# Patient Record
Sex: Female | Born: 1960
Health system: Southern US, Community
[De-identification: ages and names within clinical notes are randomized; demographics above are authoritative.]

## PROBLEM LIST (undated history)

## (undated) DIAGNOSIS — Z9109 Other allergy status, other than to drugs and biological substances: Secondary | ICD-10-CM

## (undated) DIAGNOSIS — I493 Ventricular premature depolarization: Secondary | ICD-10-CM

## (undated) DIAGNOSIS — R1310 Dysphagia, unspecified: Secondary | ICD-10-CM

## (undated) DIAGNOSIS — E785 Hyperlipidemia, unspecified: Secondary | ICD-10-CM

## (undated) HISTORY — PX: SHOULDER SURGERY: SHX246

## (undated) HISTORY — PX: COLONOSCOPY: SHX174

## (undated) HISTORY — DX: Hyperlipidemia, unspecified: E78.5

## (undated) HISTORY — DX: Ventricular premature depolarization: I49.3

## (undated) HISTORY — PX: ROTATOR CUFF REPAIR: SHX139

## (undated) HISTORY — PX: OTHER SURGICAL HISTORY: SHX169

## (undated) HISTORY — DX: Other allergy status, other than to drugs and biological substances: Z91.09

## (undated) HISTORY — DX: Dysphagia, unspecified: R13.10

---

## 1999-11-18 ENCOUNTER — Encounter: Payer: Self-pay | Admitting: Obstetrics and Gynecology

## 1999-11-18 ENCOUNTER — Encounter: Admission: RE | Admit: 1999-11-18 | Discharge: 1999-11-18 | Payer: Self-pay | Admitting: Obstetrics and Gynecology

## 2000-11-24 ENCOUNTER — Encounter: Admission: RE | Admit: 2000-11-24 | Discharge: 2000-11-24 | Payer: Self-pay | Admitting: Obstetrics and Gynecology

## 2000-11-24 ENCOUNTER — Encounter: Payer: Self-pay | Admitting: Obstetrics and Gynecology

## 2001-12-07 ENCOUNTER — Encounter: Payer: Self-pay | Admitting: Obstetrics and Gynecology

## 2001-12-07 ENCOUNTER — Encounter: Admission: RE | Admit: 2001-12-07 | Discharge: 2001-12-07 | Payer: Self-pay | Admitting: Obstetrics and Gynecology

## 2001-12-20 ENCOUNTER — Other Ambulatory Visit: Admission: RE | Admit: 2001-12-20 | Discharge: 2001-12-20 | Payer: Self-pay | Admitting: Obstetrics and Gynecology

## 2002-12-13 ENCOUNTER — Ambulatory Visit (HOSPITAL_COMMUNITY): Admission: RE | Admit: 2002-12-13 | Discharge: 2002-12-13 | Payer: Self-pay | Admitting: Obstetrics and Gynecology

## 2002-12-13 ENCOUNTER — Encounter: Payer: Self-pay | Admitting: Obstetrics and Gynecology

## 2003-03-13 ENCOUNTER — Other Ambulatory Visit: Admission: RE | Admit: 2003-03-13 | Discharge: 2003-03-13 | Payer: Self-pay | Admitting: Obstetrics and Gynecology

## 2004-01-02 ENCOUNTER — Ambulatory Visit (HOSPITAL_COMMUNITY): Admission: RE | Admit: 2004-01-02 | Discharge: 2004-01-02 | Payer: Self-pay | Admitting: Obstetrics and Gynecology

## 2005-01-02 ENCOUNTER — Ambulatory Visit (HOSPITAL_COMMUNITY): Admission: RE | Admit: 2005-01-02 | Discharge: 2005-01-02 | Payer: Self-pay | Admitting: Obstetrics and Gynecology

## 2006-01-19 ENCOUNTER — Ambulatory Visit (HOSPITAL_COMMUNITY): Admission: RE | Admit: 2006-01-19 | Discharge: 2006-01-19 | Payer: Self-pay | Admitting: Obstetrics and Gynecology

## 2007-02-02 ENCOUNTER — Ambulatory Visit (HOSPITAL_COMMUNITY): Admission: RE | Admit: 2007-02-02 | Discharge: 2007-02-02 | Payer: Self-pay | Admitting: Obstetrics and Gynecology

## 2007-12-05 ENCOUNTER — Emergency Department (HOSPITAL_COMMUNITY): Admission: EM | Admit: 2007-12-05 | Discharge: 2007-12-05 | Payer: Self-pay | Admitting: Emergency Medicine

## 2008-02-03 ENCOUNTER — Ambulatory Visit (HOSPITAL_COMMUNITY): Admission: RE | Admit: 2008-02-03 | Discharge: 2008-02-03 | Payer: Self-pay | Admitting: Obstetrics and Gynecology

## 2008-03-09 ENCOUNTER — Encounter: Admission: RE | Admit: 2008-03-09 | Discharge: 2008-03-09 | Payer: Self-pay | Admitting: Internal Medicine

## 2008-04-19 ENCOUNTER — Ambulatory Visit: Payer: Self-pay | Admitting: Gastroenterology

## 2008-04-19 DIAGNOSIS — K3189 Other diseases of stomach and duodenum: Secondary | ICD-10-CM | POA: Insufficient documentation

## 2008-04-19 DIAGNOSIS — R197 Diarrhea, unspecified: Secondary | ICD-10-CM | POA: Insufficient documentation

## 2008-04-19 DIAGNOSIS — R1013 Epigastric pain: Secondary | ICD-10-CM

## 2008-04-19 LAB — CONVERTED CEMR LAB
BUN: 10 mg/dL (ref 6–23)
Basophils Absolute: 0 10*3/uL (ref 0.0–0.1)
Basophils Relative: 0.1 % (ref 0.0–3.0)
CO2: 29 meq/L (ref 19–32)
Calcium: 9.1 mg/dL (ref 8.4–10.5)
Chloride: 109 meq/L (ref 96–112)
Creatinine, Ser: 0.8 mg/dL (ref 0.4–1.2)
Eosinophils Absolute: 0 10*3/uL (ref 0.0–0.7)
Eosinophils Relative: 0.9 % (ref 0.0–5.0)
GFR calc Af Amer: 98 mL/min
GFR calc non Af Amer: 81 mL/min
Glucose, Bld: 93 mg/dL (ref 70–99)
HCT: 39.6 % (ref 36.0–46.0)
Hemoglobin: 13.6 g/dL (ref 12.0–15.0)
IgA: 236 mg/dL (ref 68–378)
Lymphocytes Relative: 18.3 % (ref 12.0–46.0)
MCHC: 34.3 g/dL (ref 30.0–36.0)
MCV: 93.2 fL (ref 78.0–100.0)
Monocytes Absolute: 0.5 10*3/uL (ref 0.1–1.0)
Monocytes Relative: 8.8 % (ref 3.0–12.0)
Neutro Abs: 4 10*3/uL (ref 1.4–7.7)
Neutrophils Relative %: 71.9 % (ref 43.0–77.0)
Platelets: 172 10*3/uL (ref 150–400)
Potassium: 4 meq/L (ref 3.5–5.1)
RBC: 4.25 M/uL (ref 3.87–5.11)
RDW: 12.4 % (ref 11.5–14.6)
Sodium: 141 meq/L (ref 135–145)
WBC: 5.5 10*3/uL (ref 4.5–10.5)

## 2008-04-21 LAB — CONVERTED CEMR LAB: Tissue Transglutaminase Ab, IgA: 0.2 units (ref <7)

## 2008-04-24 ENCOUNTER — Telehealth: Payer: Self-pay | Admitting: Gastroenterology

## 2008-04-26 ENCOUNTER — Encounter: Payer: Self-pay | Admitting: Gastroenterology

## 2008-04-26 ENCOUNTER — Ambulatory Visit: Payer: Self-pay | Admitting: Gastroenterology

## 2008-04-28 ENCOUNTER — Encounter: Payer: Self-pay | Admitting: Gastroenterology

## 2008-05-30 ENCOUNTER — Ambulatory Visit: Payer: Self-pay | Admitting: Gastroenterology

## 2008-10-24 ENCOUNTER — Telehealth: Payer: Self-pay | Admitting: Gastroenterology

## 2008-11-22 ENCOUNTER — Ambulatory Visit: Payer: Self-pay | Admitting: Gastroenterology

## 2008-12-06 ENCOUNTER — Encounter: Payer: Self-pay | Admitting: Gastroenterology

## 2008-12-06 ENCOUNTER — Ambulatory Visit: Payer: Self-pay | Admitting: Gastroenterology

## 2008-12-07 ENCOUNTER — Encounter: Payer: Self-pay | Admitting: Gastroenterology

## 2009-02-14 ENCOUNTER — Ambulatory Visit (HOSPITAL_COMMUNITY): Admission: RE | Admit: 2009-02-14 | Discharge: 2009-02-14 | Payer: Self-pay | Admitting: Obstetrics and Gynecology

## 2010-02-25 ENCOUNTER — Ambulatory Visit (HOSPITAL_COMMUNITY): Admission: RE | Admit: 2010-02-25 | Discharge: 2010-02-25 | Payer: Self-pay | Admitting: Obstetrics and Gynecology

## 2010-09-12 NOTE — Assessment & Plan Note (Signed)
History of Present Illness Visit Type: consult Primary GI MD: Rob Bunting MD Primary Provider: Creola Corn, MD Requesting Provider: Creola Corn, MD Chief Complaint: belching History of Present Illness:    very pleasant 50 year old woman who has had dyspeptic symptoms and relatively loose stools for the past 6-7 months.  Started in march, gained 5 pounds in past 4 months.   Never before.  2-3 times a week bloating, hard stomach.  Flatus helps, belching helps.  Never gets pyrosis.  Does get mild epig pressure.  Has tried zantac, pepcid, pepto, rolaids, gas-ex.  Tried prilosec for a month, usually 20-30 min afterwards.  No difference.    Tried off coffee.    Tends to have loose bowel since March.  Once a day.  Never bloody.   Has been on probiotics for the past 3 weeks and has noticed no difference.  she had an upper GI examination the end of July 2009, this showed some mild reflux but was otherwise normal.               Prior Medications Reviewed Using: Patient Recall  Updated Prior Medication List: ZYRTEC ALLERGY 10 MG TABS (CETIRIZINE HCL) as needed MULTIVITAMINS  TABS (MULTIPLE VITAMIN) once daily CALCIUM 500/VITAMIN D 500-125 MG-UNIT TABS (CALCIUM CARBONATE-VITAMIN D) once daily FISH OIL 1200 MG CAPS (OMEGA-3 FATTY ACIDS) once daily VITAMIN C 500 MG CHEW (ASCORBIC ACID) once daily ULTIMATE PROBIOTIC FORMULA  CAPS (LACTOBACILLUS) once daily  Current Allergies: No known allergies   Past Medical History:    Reviewed history and no changes required:       Unremarkable  Past Surgical History:    Reviewed history and no changes required:       Unremarkable   Family History:    breast cancer, grandmother and aunt    Heart disease father and grandfather  Social History:    she is married, she has 4 children, she works as a Futures trader, she does not smoke cigarettes, she drinks 2 alcoholic beverages on weekends, she drinks one to 2 caffeinated beverages a  day    Review of Systems       Pertinent positive and negative review of systems were noted in the above HPI and GI specific review of systems.  All other review of systems was otherwise negative.    Vital Signs:  Patient Profile:   50 Years Old Female Height:     63 inches Weight:      122.50 pounds BMI:     21.78 Pulse rate:   68 / minute Pulse rhythm:   regular BP sitting:   110 / 68  (left arm)  Vitals Entered By: June McMurray CMA (April 19, 2008 10:25 AM)                  Physical Exam  Constitutional: generally well appearing Psychiatric: alert and oriented times 3 Eyes: extraocular movements intact Mouth: oropharynx moist, no lesions Neck: supple, no lymphadenopathy Cardiovascular: heart regular rate and rythm Lungs: CTA bilaterally Abdomen: soft, non-tender, non-distended, no obvious ascites, no peritoneal signs, normal bowel sounds Extremities: no lower extremity edema bilaterally Skin: no lesions on visible extremities     Impression & Recommendations:  Problem # 1:  DYSPEPSIA (ICD-536.8) Mild dyspeptic symptoms, she does have slight reflux on barium upper GI examination however I'm not convinced that acid is causing her symptoms and she's had zero response to proton pump inhibitor and H2 blockers all taken appropriately. Perhaps she has H.  pylori. Lactose intolerance is common and that can cause similar symptoms.  celiac sprue is also in the differential list.  She will get a basic set of labs today including a CBC a basic metabolic profile TTG and IgA. She will try a lactose-free diet for the next 5-7 days. We will arrange for an EGD to be done in about a week's time if she has noticed a significant difference off of dairy then the EGD can be canceled.    Problem # 2:  DIARRHEA (ICD-787.91) loose stools started around the same time as her upper GI symptoms, probably a similar etiology. She is 89 will have to consider colonoscopy if the above  workup is otherwise unrevealing.   Patient Instructions: 1)  You will get lab test(s) done (cbc, bmet, TTG, IgA). 2)  You will be scheduled to have an uppper endoscopy (late next week). 3)  Dairy free trial for 7 days. 4)  A copy of this information will be sent to Dr. Timothy Lasso.    ]  Appended Document: Orders Update/labs    Clinical Lists Changes  Orders: Added new Test order of TLB-CBC Platelet - w/Differential (85025-CBCD) - Signed Added new Test order of TLB-BMP (Basic Metabolic Panel-BMET) (80048-METABOL) - Signed Added new Test order of T-Tissue Transglutamase Ab IgA (69629-52841) - Signed Added new Test order of TLB-IgA (Immunoglobulin A) (82784-IGA) - Signed  Appended Document: Orders Update/EGD    Clinical Lists Changes  Orders: Added new Test order of EGD (EGD) - Signed  Appended Document: Orders Update    Clinical Lists Changes  Orders: Added new Test order of TLB-CBC Platelet - w/Differential (85025-CBCD) - Signed Added new Test order of TLB-BMP (Basic Metabolic Panel-BMET) (80048-METABOL) - Signed Added new Test order of TLB-IgA (Immunoglobulin A) (82784-IGA) - Signed

## 2010-09-12 NOTE — Assessment & Plan Note (Signed)
  GI problem list: 1. Dyspepsia, EGD September 2009 showed mild gastritis. Biopsies confirmed H. pylori, she was treated with appropriate antibiotics.  Dyspepsia improved after antibiotics.    History of Present Illness Visit Type: follow up Primary GI MD: Rob Bunting MD Primary Murlene Revell: Creola Corn, MD Requesting Bailee Metter: Creola Corn, MD Chief Complaint: follow-up visit, egd History of Present Illness:     Began antibiotics for h. pylori.  Took a while, but dyspepsia completely resolved and she feels very well now. She has minor belching only.  Bowel no longer loose.             Updated Prior Medication List: ZYRTEC ALLERGY 10 MG TABS (CETIRIZINE HCL) as needed MULTIVITAMINS  TABS (MULTIPLE VITAMIN) once daily CALCIUM 500/VITAMIN D 500-125 MG-UNIT TABS (CALCIUM CARBONATE-VITAMIN D) once daily FISH OIL 1200 MG CAPS (OMEGA-3 FATTY ACIDS) once daily VITAMIN C 500 MG CHEW (ASCORBIC ACID) once daily  Current Allergies (reviewed today): No known allergies       Vital Signs:  Patient Profile:   50 Years Old Female Height:     63 inches Weight:      124 pounds BMI:     22.05 BSA:     1.58 Pulse rate:   64 / minute Pulse rhythm:   regular BP sitting:   100 / 62  (right arm)  Vitals Entered By: Teresita Madura CMA (May 30, 2008 1:55 PM)                  Physical Exam  Constitutional: generally well appearing Psychiatric: alert and oriented times 3 Abdomen: soft, non-tender, non-distended, normal bowel sounds     Impression & Recommendations:  Problem # 1:  DYSPEPSIA (ICD-536.8) dyspepsia improved after H. pylori eradication. [name]. [name].   Patient Instructions: 1)  A copy of this information will be sent to Dr. Timothy Lasso. 2)  [name]. 3)  [name].    ]

## 2010-09-12 NOTE — Letter (Signed)
Summary: Results Letter  [facility]  [address] [place], [place] [address]   Phone: [phone]  Fax: [phone]        December 07, 2008 MRN: [id number]    KAREENA ARRAMBIDE [address] [place], [place]  [address]    Rosey Bath,   The polyp that I removed during your recent procedure was proven to be adenomatous.  These are pre-cancerous polyps that may have grown into cancers if they had not been removed.  Based on current nationally recognized surveillance guidelines, I recommend that you have a repeat colonoscopy in 5 years.  We will therefore put your information in our reminder system and will contact you in 5 years to schedule a repeat procedure.  Please call if you have any questions or concerns or if any new GI issues arise before then.  Go Cubs!       Sincerely,  Rachael Fee MD  This letter has been electronically signed by your physician.

## 2010-09-12 NOTE — Procedures (Signed)
Summary: Colonoscopy   Colonoscopy  Procedure date:  12/06/2008  Findings:      Location:  [facility].    Procedures Next Due Date:    Colonoscopy: 12/2013  COLONOSCOPY PROCEDURE REPORT  PATIENT:  Destiny [name], Destiny [name]  MR#:  [id number] BIRTHDATE:   December 08, 1960, 50 yrs. old   GENDER:   female  ENDOSCOPIST:   Rachael Fee, MD Referred by: Creola Corn, M.D.  PROCEDURE DATE:  12/06/2008 PROCEDURE:  Colonoscopy with snare polypectomy ASA CLASS:   Class II INDICATIONS: family Hx of polyps (her brother had a small pre-cancerous polyp)  MEDICATIONS:    Fentanyl 75 mcg IV, Versed 10 mg IV  DESCRIPTION OF PROCEDURE:   After the risks benefits and alternatives of the procedure were thoroughly explained, informed consent was obtained.  Digital rectal exam was performed and revealed no rectal masses.   The LB [id number] endoscope was introduced through the anus and advanced to the cecum, which was identified by both the appendix and ileocecal valve, without limitations.  The quality of the prep was excellent, using MoviPrep.  The instrument was then slowly withdrawn as the colon was fully examined. <<PROCEDUREIMAGES>>            <<OLD IMAGES>>  FINDINGS:  There was a 4mm, somewhat pedunculated polyp in the rectum. This was removed with snare cautery and sent to pathology (jar 1) (see image5, image6, and image7).  External hemorrhoids were found. These were small and non-thrombosed.  Mild diverticulosis was found sigmoid to descending colon segments.   This was otherwise a normal examination of the colon (see image2, image3, and image4).   Retroflexed views in the rectum revealed no abnormalities.   The scope was then withdrawn from the patient and the procedure completed.  COMPLICATIONS:   None  ENDOSCOPIC IMPRESSION:  1) 4mm pedunculated polyp in the rectum, removed and sent to pathology  2) External hemorrhoids, small  3) Mild diverticulosis in the sigmoid to  descending  4) Otherwise normal examination  RECOMMENDATIONS:  1) If the polyp(s) removed today are proven to be adenomatous (pre-cancerous) polyps, you will need a repeat colonoscopy in 5 years. Otherwise you should continue to follow colorectal cancer screening guidelines for "routine risk" patients with colonoscopy in 10 years.  2) You will recieve a letter within 1-2 weeks with the results of your biopsy as well as final recommendations. Please call my office if you have not received a letter after 3 weeks.  REPEAT EXAM:   await pathology   _______________________________ Rachael Fee, MD           REPORT OF SURGICAL PATHOLOGY   Case #: [id number] Patient Name: Destiny [name], Destiny [name]. Office Chart Number:  [id number]   MRN: [id number] Pathologist: H. Hollice Espy, MD DOB/Age  17-Nov-1960 (Age: 50)    Gender: F Date Taken:  12/06/2008 Date Received: 12/06/2008   FINAL DIAGNOSIS   ***MICROSCOPIC EXAMINATION AND DIAGNOSIS***   RECTUM, POLYP, BIOPSY:  TUBULAR ADENOMA.  NO HIGH GRADE DYSPLASIA OR MALIGNANCY IDENTIFIED (ONE FRAGMENT).   COMMENT There is adenomatous epithelium having a predominantly tubular growth pattern consistent with a tubular adenoma if the biopsy is representative of the entire lesion. No high grade dysplasia or evidence of malignancy is identified. Clinical correlation is recommended.  (HCL:mw, 12/07/08)    mw Date Reported:  12/07/2008     H. Hollice Espy, MD *** Electronically Signed Out By [facility] ***   December 07, 2008 MRN: [id number]    [facility]  [address] [place], [place]  [address]    Rosey Bath,   The polyp that I removed during your recent procedure was proven to be adenomatous.  These are pre-cancerous polyps that may have grown into cancers if they had not been removed.  Based on current nationally recognized surveillance guidelines, I recommend that you have a repeat colonoscopy in 5 years.  We will therefore put your information in  our reminder system and will contact you in 5 years to schedule a repeat procedure.  Please call if you have any questions or concerns or if any new GI issues arise before then.  Go Cubs!       Sincerely,  Rachael Fee MD  This letter has been electronically signed by your physician.   Signed by Rachael Fee MD on 12/07/2008 at 12:01 PM   This report was created from the original endoscopy report, which was reviewed and signed by the above listed endoscopist.    ________________________________________________________________________

## 2010-09-12 NOTE — Miscellaneous (Signed)
Summary: rx  Clinical Lists Changes  Medications: Added new medication of PREVPAC   MISC (AMOXICILL-CLARITHRO-LANSOPRAZ) take as directed - Signed Rx of PREVPAC   MISC (AMOXICILL-CLARITHRO-LANSOPRAZ) take as directed;  #1 x 0;  Signed;  Entered by: Rachael Fee MD;  Authorized by: Rachael Fee MD;  Method used: [facility]  [phone]*, [address], [place], [place]  [address], Ph: [phone] or [phone], Fax: [phone]    Prescriptions: PREVPAC   MISC (AMOXICILL-CLARITHRO-LANSOPRAZ) take as directed  #1 x 0   Entered and Authorized by:   Rachael Fee MD   Signed by:   Rachael Fee MD on 04/28/2008   Method used:   [facility]ically to        [facility]  [phone]* (retail)       [address] [place], [place]  [address]       Ph: [phone] or [phone]       Fax: [phone]   RxID:   [id number]

## 2010-09-12 NOTE — Procedures (Signed)
Summary: EGD   EGD  Procedure date:  04/26/2008  Findings:      Location: [facility]    Patient Name: Sheray, Grist MRN:  Procedure Procedures: Panendoscopy (EGD) CPT: 43235.    with biopsy(s)/brushing(s). CPT: D1846139.  Personnel: Endoscopist: Rachael Fee, MD.  Referred By: Creola Corn, MD.  Exam Location: Exam performed in Endoscopy Suite. Outpatient  Patient Consent: Procedure, Alternatives, Risks and Benefits discussed, consent obtained, from patient. Consent was obtained by the RN.  Indications Symptoms: Dyspepsia,  Comments: loose stools History  Current Medications: Patient is not currently taking Coumadin.  Comments: Patient history reviewed and updated, pre-procedure physical performed prior to initiation of sedation? yes Pre-Exam Physical: Performed Apr 26, 2008  Cardio-pulmonary exam, Abdominal exam, Mental status exam WNL.  Exam Exam Info: Maximum depth of insertion Duodenum, intended Duodenum. Patient position: on left side. Gastric retroflexion performed. Images taken. ASA Classification: II. Tolerance: good.  Sedation Meds: Patient assessed and found to be appropriate for moderate (conscious) sedation. Fentanyl 50 mcg. given IV. Versed 6 mg. given IV. Cetacaine Spray 2 sprays given aerosolized.  Monitoring: BP and pulse monitoring done. Oximetry used. Supplemental O2 given  Findings - Normal: Proximal Esophagus to Duodenal 2nd Portion. Comments: OTHERWISE NORMAL EXAMINATION.  - MUCOSAL ABNORMALITY: Body to Antrum. Biopsy/Mucosal Abn taken. Path # 1. Comment: MILD, NON-SPECIFIC GASTRITIS, FRIABLE MUCOSA.   Assessment Abnormal examination, see findings above.  Comments: MILD, NON-SPECIFIC GASTRITIS.  BIOPSIES WERE TAKEN TO CHECK FOR H. PYLORI AND IF POSITIVE THIS COULD EXPLAIN HER SYMPTOMS AND I WOULD START HER ON H. PYLORI ERADICATION ANTIBIOTICS.   IF NEGATIVE FOR H. PYLORI, WILL START HER ON FLAGYL EMPIRICALLY FOR HER  DYSPEPTIC SYMPTOMS, LOOSE STOOLS. Events  Unplanned Intervention: No unplanned interventions were required.  Unplanned Events: There were no complications.    cc.   Jonny Ruiz Russo,MD   REPORT OF SURGICAL PATHOLOGY   Case #: [id number] Patient Name: SYNIA, DOUGLASS. Office Chart Number:  [id number]   MRN: [id number] Pathologist: Alden Server A. Delila Spence, MD DOB/Age  20-Sep-1960 (Age: 24)    Gender: F Date Taken:  04/26/2008 Date Received: 04/26/2008   FINAL DIAGNOSIS   ***MICROSCOPIC EXAMINATION AND DIAGNOSIS***   STOMACH, BIOPSY:  CHRONIC, ACTIVE GASTRITIS WITH HELICOBACTER PYLORI.  NO DYSPLASIA OR MALIGNANCY IDENTIFIED.   COMMENT A Warthin-Starry stain is performed to determine the possibility of the presence of Helicobacter pylori. Organisms of Helicobacter pylori are identified on the Warthin-Starry stain. The control(s) stained appropriately. (EAA:mj 04/27/08)   mj Date Reported:  04/27/2008     Alden Server A. Delila Spence, MD *** Electronically Signed Out By [facility] ***   Clinical information R/O H. pylori (jy)   specimen(s) obtained Stomach, biopsy   Gross Description Received in formalin are tan, soft tissue fragments that are submitted in toto.  Number:  4          Size:      0.2 to 0.4 cm  (JBM:kv 04-26-08)   kv/     Signed by Rachael Fee MD on 04/28/2008 at 9:14 AM  ________________________________________________________________________ i lm on her vm, will call in prev pac   Signed by Rachael Fee MD on 04/28/2008 at 9:15 AM  ________________________________________________________________________ please call her to arrange rov in 4-5 weeks   Signed by Rachael Fee MD on 04/28/2008 at 9:21 AM  ________________________________________________________________________ Message left for pt. to call in and make an appt. in 4 weeks   Signed by Teryl Lucy RN on 05/01/2008 at 9:37  AM  ________________________________________________________________________    [facility] [address] [place], [place]  [address]    Dear Rosey Bath,  The biopsies taken during your recent procedure showed that you do have the H. pylori bacteria in your stomach. I suspect this is causing most of your symptoms. I left a message on your voicemail explaining the same and have called in a prescription for you for two antibiotics and one antiacid medicine to be taken as directed that should eradicate this bacteria. I would like to see you in my office in about one month to see how this has helped you.  Please feel free to call if you have any further questions or concerns.        Sincerely,  Rachael Fee MD  This letter has been electronically signed by your physician.   Signed by Rachael Fee MD on 04/28/2008 at 9:18 AM  ________________________________________________________________________

## 2010-09-12 NOTE — Miscellaneous (Signed)
Summary: [facility] Previsit/prep  Clinical Lists Changes  Medications: Added new medication of MOVIPREP 100 GM  SOLR (PEG-KCL-NACL-NASULF-NA ASC-C) As per prep instructions. - Signed Rx of MOVIPREP 100 GM  SOLR (PEG-KCL-NACL-NASULF-NA ASC-C) As per prep instructions.;  #1 x 0;  Signed;  Entered by: Wyona Almas RN;  Authorized by: Rachael Fee MD;  Method used: [facility]  [phone]*, [address], [place], [place]  [address], Ph: [phone] or [phone], Fax: [phone] Observations: Added new observation of NKA: T (11/22/2008 12:54)    Prescriptions: MOVIPREP 100 GM  SOLR (PEG-KCL-NACL-NASULF-NA ASC-C) As per prep instructions.  #1 x 0   Entered by:   Wyona Almas RN   Authorized by:   Rachael Fee MD   Signed by:   Wyona Almas RN on 11/22/2008   Method used:   Electronically to        [facility]  [phone]* (retail)       [address] [place], [place]  [address]       Ph: [phone] or [phone]       Fax: [phone]   RxID:   [id number]

## 2010-09-12 NOTE — Progress Notes (Signed)
Summary: proc on Wed  Phone Note Refill Request Call back at [phone]   Caller: Patient Call For: JACOBS Details for Reason: proc on Wed Summary of Call: sch for EGD on WEd wondering if Colon had be done at the same time since pt is 50 yrs old Initial call taken by: Guadlupe [facility],  April 24, 2008 4:44 PM  Follow-up for Phone Call        Pt has been off dairy products x1 week and it has not made any difference in her symptoms. Wanting to know if a colon should be added to her procedure on Wednesday.  Teryl Lucy RN  April 24, 2008 5:03 PM   Additional Follow-up for Phone Call Additional follow up Details #1::        lets do the egd.  if that isn't helpful, she's going to be put on empiric abx (flagyl).  i don't think colo is necessary at this point, but if flagyl isn't helpful...should go ahead with colonoscopy Additional Follow-up by: Rachael Fee MD,  April 25, 2008 7:46 AM    Additional Follow-up for Phone Call Additional follow up Details #2::    Pt. notified of Dr. Christella Hartigan directives about having colon now.  Teryl Lucy RN  April 25, 2008 12:56 PM

## 2010-09-12 NOTE — Progress Notes (Signed)
Summary: when is her colon due?  Phone Note Call from Patient Call back at [phone] cell   Call For: Dr Christella Hartigan Reason for Call: Talk to Nurse Summary of Call: When is she due for Colon-her brother had colon cancer. Initial call taken by: Leanor Kail [facility],  October 24, 2008 11:59 AM  Follow-up for Phone Call        now...up in [facility], whenever she is ready. Follow-up by: Rachael Fee MD,  October 24, 2008 1:27 PM  Additional Follow-up for Phone Call Additional follow up Details #1::        Message left for pt. to call and schedule colon at her convience. Additional Follow-up by: Teryl Lucy RN,  October 24, 2008 3:07 PM

## 2010-11-19 ENCOUNTER — Other Ambulatory Visit: Payer: Self-pay | Admitting: Obstetrics and Gynecology

## 2011-01-31 ENCOUNTER — Other Ambulatory Visit (HOSPITAL_COMMUNITY): Payer: Self-pay | Admitting: Obstetrics and Gynecology

## 2011-01-31 DIAGNOSIS — Z1231 Encounter for screening mammogram for malignant neoplasm of breast: Secondary | ICD-10-CM

## 2011-03-13 ENCOUNTER — Ambulatory Visit (HOSPITAL_COMMUNITY)
Admission: RE | Admit: 2011-03-13 | Discharge: 2011-03-13 | Disposition: A | Discharge status: Home / Self Care | Payer: 59 | Source: Ambulatory Visit | Attending: Obstetrics and Gynecology | Admitting: Obstetrics and Gynecology

## 2011-03-13 DIAGNOSIS — Z1231 Encounter for screening mammogram for malignant neoplasm of breast: Secondary | ICD-10-CM | POA: Insufficient documentation

## 2012-03-15 ENCOUNTER — Other Ambulatory Visit (HOSPITAL_COMMUNITY): Payer: Self-pay | Admitting: Obstetrics and Gynecology

## 2012-03-15 DIAGNOSIS — Z1231 Encounter for screening mammogram for malignant neoplasm of breast: Secondary | ICD-10-CM

## 2012-03-16 ENCOUNTER — Ambulatory Visit (HOSPITAL_COMMUNITY)
Admission: RE | Admit: 2012-03-16 | Discharge: 2012-03-16 | Disposition: A | Discharge status: Home / Self Care | Payer: 59 | Source: Ambulatory Visit | Attending: Obstetrics and Gynecology | Admitting: Obstetrics and Gynecology

## 2012-03-16 DIAGNOSIS — Z1231 Encounter for screening mammogram for malignant neoplasm of breast: Secondary | ICD-10-CM | POA: Insufficient documentation

## 2013-03-21 ENCOUNTER — Other Ambulatory Visit (HOSPITAL_COMMUNITY): Payer: Self-pay | Admitting: Obstetrics and Gynecology

## 2013-03-21 DIAGNOSIS — Z1231 Encounter for screening mammogram for malignant neoplasm of breast: Secondary | ICD-10-CM

## 2013-03-23 ENCOUNTER — Ambulatory Visit (HOSPITAL_COMMUNITY)
Admission: RE | Admit: 2013-03-23 | Discharge: 2013-03-23 | Disposition: A | Discharge status: Home / Self Care | Payer: 59 | Source: Ambulatory Visit | Attending: Obstetrics and Gynecology | Admitting: Obstetrics and Gynecology

## 2013-03-23 DIAGNOSIS — Z1231 Encounter for screening mammogram for malignant neoplasm of breast: Secondary | ICD-10-CM | POA: Insufficient documentation

## 2013-10-31 ENCOUNTER — Encounter: Payer: Self-pay | Admitting: Gastroenterology

## 2014-02-17 ENCOUNTER — Other Ambulatory Visit (HOSPITAL_COMMUNITY): Payer: Self-pay | Admitting: Obstetrics and Gynecology

## 2014-02-17 DIAGNOSIS — Z1231 Encounter for screening mammogram for malignant neoplasm of breast: Secondary | ICD-10-CM

## 2014-03-28 ENCOUNTER — Encounter: Payer: Self-pay | Admitting: Gastroenterology

## 2014-03-28 ENCOUNTER — Ambulatory Visit (HOSPITAL_COMMUNITY)
Admission: RE | Admit: 2014-03-28 | Discharge: 2014-03-28 | Disposition: A | Discharge status: Home / Self Care | Payer: 59 | Source: Ambulatory Visit | Attending: Obstetrics and Gynecology | Admitting: Obstetrics and Gynecology

## 2014-03-28 DIAGNOSIS — Z1231 Encounter for screening mammogram for malignant neoplasm of breast: Secondary | ICD-10-CM | POA: Diagnosis not present

## 2014-05-12 ENCOUNTER — Ambulatory Visit (AMBULATORY_SURGERY_CENTER): Payer: BC Managed Care – PPO

## 2014-05-12 VITALS — Ht 63.0 in | Wt 122.8 lb

## 2014-05-12 DIAGNOSIS — Z860100 Personal history of colon polyps, unspecified: Secondary | ICD-10-CM

## 2014-05-12 NOTE — Progress Notes (Signed)
No past problems with anesthesia No allergies to eggs or soy No diet/weight loss meds No home oxygen  Has email  Emmi instructions given for colonoscopy

## 2014-05-19 ENCOUNTER — Encounter: Payer: Self-pay | Admitting: Gastroenterology

## 2014-05-22 ENCOUNTER — Telehealth: Payer: Self-pay | Admitting: Gastroenterology

## 2014-05-22 MED ORDER — MOVIPREP 100 G PO SOLR: 1.0000 | Freq: Once | ORAL | Status: DC

## 2014-05-22 NOTE — Telephone Encounter (Signed)
rx sent

## 2014-05-26 ENCOUNTER — Encounter: Payer: Self-pay | Admitting: Gastroenterology

## 2014-05-26 ENCOUNTER — Ambulatory Visit (AMBULATORY_SURGERY_CENTER): Payer: BC Managed Care – PPO | Admitting: Gastroenterology

## 2014-05-26 VITALS — BP 99/69 | HR 57 | Temp 97.7°F | Resp 23 | Ht 63.0 in | Wt 122.0 lb

## 2014-05-26 DIAGNOSIS — Z86010 Personal history of colonic polyps: Secondary | ICD-10-CM

## 2014-05-26 MED ORDER — SODIUM CHLORIDE 0.9 % IV SOLN: 500.0000 mL | INTRAVENOUS | Status: DC

## 2014-05-26 NOTE — Op Note (Signed)
[facility] [address] [place], [id number]   COLONOSCOPY PROCEDURE REPORT  PATIENT: Destiny CroakKenny, Destiny F  MR#: [id number] BIRTHDATE: 1960/09/01 , 53  yrs. old GENDER: female ENDOSCOPIST: Rachael Feeaniel P Jacobs, MD REFERRED XB:JYNWBY:John Timothy Lassousso, M.D. PROCEDURE DATE:  05/26/2014 PROCEDURE:   Colonoscopy, surveillance First Screening Colonoscopy - Avg.  risk and is 50 yrs.  old or older - No.  Prior Negative Screening - Now for repeat screening. N/A  History of Adenoma - Now for follow-up colonoscopy & has been > or = to 3 yrs.  Yes hx of adenoma.  Has been 3 or more years since last colonoscopy.  Polyps Removed Today? No.  Recommend repeat exam, <10 yrs? No. ASA CLASS:   Class II INDICATIONS:single 4mm adenomatous polyp, colonoscopy Dr.  Christella HartiganJacobs 2010. MEDICATIONS: Monitored anesthesia care and Propofol 200 mg IV  DESCRIPTION OF PROCEDURE:   After the risks benefits and alternatives of the procedure were thoroughly explained, informed consent was obtained.  The digital rectal exam revealed no abnormalities of the rectum.   The LB [id number]  endoscope was introduced through the anus and advanced to the cecum, which was identified by both the appendix and ileocecal valve. No adverse events experienced.   The quality of the prep was excellent.  The instrument was then slowly withdrawn as the colon was fully examined.   COLON FINDINGS: A normal appearing cecum, ileocecal valve, and appendiceal orifice were identified.  The ascending, transverse, descending, sigmoid colon, and rectum appeared unremarkable. Retroflexed views revealed no abnormalities. The time to cecum=5 minutes 10 seconds.  Withdrawal time=9 minutes 30 seconds.  The scope was withdrawn and the procedure completed. COMPLICATIONS: There were no immediate complications.  ENDOSCOPIC IMPRESSION: Normal colonoscopy No polyps or cancers  RECOMMENDATIONS: You should continue to follow colorectal cancer screening  guidelines for "routine risk" patients with a repeat colonoscopy in 10 years.  eSigned:  Rachael Feeaniel P Jacobs, MD 05/26/2014 9:46 AM

## 2014-05-26 NOTE — Patient Instructions (Signed)
YOU HAD AN ENDOSCOPIC PROCEDURE TODAY AT THE [facility]: Refer to the procedure report that was given to you for any specific questions about what was found during the examination.  If the procedure report does not answer your questions, please call your gastroenterologist to clarify.  If you requested that your care partner not be given the details of your procedure findings, then the procedure report has been included in a sealed envelope for you to review at your convenience later.  YOU SHOULD EXPECT: Some feelings of bloating in the abdomen. Passage of more gas than usual.  Walking can help get rid of the air that was put into your GI tract during the procedure and reduce the bloating. If you had a lower endoscopy (such as a colonoscopy or flexible sigmoidoscopy) you may notice spotting of blood in your stool or on the toilet paper. If you underwent a bowel prep for your procedure, then you may not have a normal bowel movement for a few days.  DIET: Your first meal following the procedure should be a light meal and then it is ok to progress to your normal diet.  A half-sandwich or bowl of soup is an example of a good first meal.  Heavy or fried foods are harder to digest and may make you feel nauseous or bloated.  Likewise meals heavy in dairy and vegetables can cause extra gas to form and this can also increase the bloating.  Drink plenty of fluids but you should avoid alcoholic beverages for 24 hours.  ACTIVITY: Your care partner should take you home directly after the procedure.  You should plan to take it easy, moving slowly for the rest of the day.  You can resume normal activity the day after the procedure however you should NOT DRIVE or use heavy machinery for 24 hours (because of the sedation medicines used during the test).    SYMPTOMS TO REPORT IMMEDIATELY: A gastroenterologist can be reached at any hour.  During normal business hours, 8:30 AM to 5:00 PM Monday through Friday,  call [phone].  After hours and on weekends, please call the GI answering service at [phone] who will take a message and have the physician on call contact you.   Following lower endoscopy (colonoscopy or flexible sigmoidoscopy):  Excessive amounts of blood in the stool  Significant tenderness or worsening of abdominal pains  Swelling of the abdomen that is new, acute  Fever of 100F or higher    FOLLOW UP: If any biopsies were taken you will be contacted by phone or by letter within the next 1-3 weeks.  Call your gastroenterologist if you have not heard about the biopsies in 3 weeks.  Our staff will call the home number listed on your records the next business day following your procedure to check on you and address any questions or concerns that you may have at that time regarding the information given to you following your procedure. This is a courtesy call and so if there is no answer at the home number and we have not heard from you through the emergency physician on call, we will assume that you have returned to your regular daily activities without incident.  SIGNATURES/CONFIDENTIALITY: You and/or your care partner have signed paperwork which will be entered into your electronic medical record.  These signatures attest to the fact that that the information above on your After Visit Summary has been reviewed and is understood.  Full responsibility of the confidentiality   of this discharge information lies with you and/or your care-partner.  Normal colonoscopy.  Repeat in 10 years- 

## 2014-05-26 NOTE — Progress Notes (Signed)
A/ox3 pleased with MAC, report to Jane RN 

## 2014-05-29 ENCOUNTER — Telehealth: Payer: Self-pay | Admitting: *Deleted

## 2014-05-29 NOTE — Telephone Encounter (Signed)
Message left

## 2014-06-23 ENCOUNTER — Other Ambulatory Visit: Payer: Self-pay | Admitting: Dermatology

## 2014-07-17 ENCOUNTER — Other Ambulatory Visit: Payer: Self-pay | Admitting: Dermatology

## 2015-01-02 ENCOUNTER — Other Ambulatory Visit (HOSPITAL_COMMUNITY): Payer: Self-pay | Admitting: Obstetrics and Gynecology

## 2015-01-02 DIAGNOSIS — N951 Menopausal and female climacteric states: Secondary | ICD-10-CM

## 2015-01-10 ENCOUNTER — Ambulatory Visit (HOSPITAL_COMMUNITY)
Admission: RE | Admit: 2015-01-10 | Discharge: 2015-01-10 | Disposition: A | Discharge status: Home / Self Care | Payer: BLUE CROSS/BLUE SHIELD | Source: Ambulatory Visit | Attending: Obstetrics and Gynecology | Admitting: Obstetrics and Gynecology

## 2015-01-10 DIAGNOSIS — Z13820 Encounter for screening for osteoporosis: Secondary | ICD-10-CM | POA: Insufficient documentation

## 2015-01-10 DIAGNOSIS — N951 Menopausal and female climacteric states: Secondary | ICD-10-CM

## 2015-04-05 ENCOUNTER — Other Ambulatory Visit (HOSPITAL_COMMUNITY): Payer: Self-pay | Admitting: Obstetrics and Gynecology

## 2015-04-05 DIAGNOSIS — Z1231 Encounter for screening mammogram for malignant neoplasm of breast: Secondary | ICD-10-CM

## 2015-04-11 ENCOUNTER — Ambulatory Visit (HOSPITAL_COMMUNITY)
Admission: RE | Admit: 2015-04-11 | Discharge: 2015-04-11 | Disposition: A | Discharge status: Home / Self Care | Payer: BLUE CROSS/BLUE SHIELD | Source: Ambulatory Visit | Attending: Obstetrics and Gynecology | Admitting: Obstetrics and Gynecology

## 2015-04-11 DIAGNOSIS — Z1231 Encounter for screening mammogram for malignant neoplasm of breast: Secondary | ICD-10-CM

## 2015-12-04 DIAGNOSIS — Z0000 Encounter for general adult medical examination without abnormal findings: Secondary | ICD-10-CM | POA: Diagnosis not present

## 2015-12-04 DIAGNOSIS — N390 Urinary tract infection, site not specified: Secondary | ICD-10-CM | POA: Diagnosis not present

## 2015-12-04 DIAGNOSIS — R8299 Other abnormal findings in urine: Secondary | ICD-10-CM | POA: Diagnosis not present

## 2015-12-11 DIAGNOSIS — Z1389 Encounter for screening for other disorder: Secondary | ICD-10-CM | POA: Diagnosis not present

## 2015-12-11 DIAGNOSIS — G4700 Insomnia, unspecified: Secondary | ICD-10-CM | POA: Diagnosis not present

## 2015-12-11 DIAGNOSIS — M859 Disorder of bone density and structure, unspecified: Secondary | ICD-10-CM | POA: Diagnosis not present

## 2015-12-11 DIAGNOSIS — Z0000 Encounter for general adult medical examination without abnormal findings: Secondary | ICD-10-CM | POA: Diagnosis not present

## 2015-12-11 DIAGNOSIS — N39498 Other specified urinary incontinence: Secondary | ICD-10-CM | POA: Diagnosis not present

## 2015-12-11 DIAGNOSIS — F418 Other specified anxiety disorders: Secondary | ICD-10-CM | POA: Diagnosis not present

## 2015-12-12 DIAGNOSIS — Z23 Encounter for immunization: Secondary | ICD-10-CM | POA: Diagnosis not present

## 2015-12-18 DIAGNOSIS — Z0100 Encounter for examination of eyes and vision without abnormal findings: Secondary | ICD-10-CM | POA: Diagnosis not present

## 2016-01-09 ENCOUNTER — Encounter: Payer: Self-pay | Admitting: Gastroenterology

## 2016-01-28 DIAGNOSIS — Z6821 Body mass index (BMI) 21.0-21.9, adult: Secondary | ICD-10-CM | POA: Diagnosis not present

## 2016-01-28 DIAGNOSIS — Z01419 Encounter for gynecological examination (general) (routine) without abnormal findings: Secondary | ICD-10-CM | POA: Diagnosis not present

## 2016-03-26 ENCOUNTER — Other Ambulatory Visit: Payer: Self-pay | Admitting: Obstetrics and Gynecology

## 2016-03-26 DIAGNOSIS — Z1231 Encounter for screening mammogram for malignant neoplasm of breast: Secondary | ICD-10-CM

## 2016-05-05 ENCOUNTER — Ambulatory Visit
Admission: RE | Admit: 2016-05-05 | Discharge: 2016-05-05 | Disposition: A | Discharge status: Home / Self Care | Payer: BLUE CROSS/BLUE SHIELD | Source: Ambulatory Visit | Attending: Obstetrics and Gynecology | Admitting: Obstetrics and Gynecology

## 2016-05-05 DIAGNOSIS — Z1231 Encounter for screening mammogram for malignant neoplasm of breast: Secondary | ICD-10-CM

## 2016-05-07 DIAGNOSIS — Z85828 Personal history of other malignant neoplasm of skin: Secondary | ICD-10-CM | POA: Diagnosis not present

## 2016-05-07 DIAGNOSIS — L821 Other seborrheic keratosis: Secondary | ICD-10-CM | POA: Diagnosis not present

## 2016-05-07 DIAGNOSIS — L570 Actinic keratosis: Secondary | ICD-10-CM | POA: Diagnosis not present

## 2016-05-13 DIAGNOSIS — Z23 Encounter for immunization: Secondary | ICD-10-CM | POA: Diagnosis not present

## 2016-08-07 DIAGNOSIS — L718 Other rosacea: Secondary | ICD-10-CM | POA: Diagnosis not present

## 2016-08-07 DIAGNOSIS — L245 Irritant contact dermatitis due to other chemical products: Secondary | ICD-10-CM | POA: Diagnosis not present

## 2016-09-05 DIAGNOSIS — J309 Allergic rhinitis, unspecified: Secondary | ICD-10-CM | POA: Diagnosis not present

## 2016-09-05 DIAGNOSIS — R21 Rash and other nonspecific skin eruption: Secondary | ICD-10-CM | POA: Diagnosis not present

## 2016-10-29 DIAGNOSIS — D2272 Melanocytic nevi of left lower limb, including hip: Secondary | ICD-10-CM | POA: Diagnosis not present

## 2016-10-29 DIAGNOSIS — D225 Melanocytic nevi of trunk: Secondary | ICD-10-CM | POA: Diagnosis not present

## 2016-10-29 DIAGNOSIS — Z85828 Personal history of other malignant neoplasm of skin: Secondary | ICD-10-CM | POA: Diagnosis not present

## 2016-10-29 DIAGNOSIS — L718 Other rosacea: Secondary | ICD-10-CM | POA: Diagnosis not present

## 2016-10-29 DIAGNOSIS — C44519 Basal cell carcinoma of skin of other part of trunk: Secondary | ICD-10-CM | POA: Diagnosis not present

## 2016-10-29 DIAGNOSIS — L570 Actinic keratosis: Secondary | ICD-10-CM | POA: Diagnosis not present

## 2016-12-08 DIAGNOSIS — L245 Irritant contact dermatitis due to other chemical products: Secondary | ICD-10-CM | POA: Diagnosis not present

## 2016-12-08 DIAGNOSIS — Z85828 Personal history of other malignant neoplasm of skin: Secondary | ICD-10-CM | POA: Diagnosis not present

## 2016-12-08 DIAGNOSIS — L718 Other rosacea: Secondary | ICD-10-CM | POA: Diagnosis not present

## 2016-12-09 DIAGNOSIS — M859 Disorder of bone density and structure, unspecified: Secondary | ICD-10-CM | POA: Diagnosis not present

## 2016-12-09 DIAGNOSIS — Z0000 Encounter for general adult medical examination without abnormal findings: Secondary | ICD-10-CM | POA: Diagnosis not present

## 2016-12-16 DIAGNOSIS — F418 Other specified anxiety disorders: Secondary | ICD-10-CM | POA: Diagnosis not present

## 2016-12-16 DIAGNOSIS — M859 Disorder of bone density and structure, unspecified: Secondary | ICD-10-CM | POA: Diagnosis not present

## 2016-12-16 DIAGNOSIS — L718 Other rosacea: Secondary | ICD-10-CM | POA: Diagnosis not present

## 2016-12-16 DIAGNOSIS — D72819 Decreased white blood cell count, unspecified: Secondary | ICD-10-CM | POA: Diagnosis not present

## 2016-12-16 DIAGNOSIS — Z0000 Encounter for general adult medical examination without abnormal findings: Secondary | ICD-10-CM | POA: Diagnosis not present

## 2016-12-16 DIAGNOSIS — Z1389 Encounter for screening for other disorder: Secondary | ICD-10-CM | POA: Diagnosis not present

## 2016-12-23 DIAGNOSIS — H524 Presbyopia: Secondary | ICD-10-CM | POA: Diagnosis not present

## 2016-12-23 DIAGNOSIS — H5213 Myopia, bilateral: Secondary | ICD-10-CM | POA: Diagnosis not present

## 2017-01-27 DIAGNOSIS — Z85828 Personal history of other malignant neoplasm of skin: Secondary | ICD-10-CM | POA: Diagnosis not present

## 2017-01-27 DIAGNOSIS — L718 Other rosacea: Secondary | ICD-10-CM | POA: Diagnosis not present

## 2017-01-27 DIAGNOSIS — L570 Actinic keratosis: Secondary | ICD-10-CM | POA: Diagnosis not present

## 2017-01-27 DIAGNOSIS — L245 Irritant contact dermatitis due to other chemical products: Secondary | ICD-10-CM | POA: Diagnosis not present

## 2017-01-29 DIAGNOSIS — Z01419 Encounter for gynecological examination (general) (routine) without abnormal findings: Secondary | ICD-10-CM | POA: Diagnosis not present

## 2017-01-29 DIAGNOSIS — Z6822 Body mass index (BMI) 22.0-22.9, adult: Secondary | ICD-10-CM | POA: Diagnosis not present

## 2017-05-19 DIAGNOSIS — Z23 Encounter for immunization: Secondary | ICD-10-CM | POA: Diagnosis not present

## 2017-05-25 DIAGNOSIS — Z1231 Encounter for screening mammogram for malignant neoplasm of breast: Secondary | ICD-10-CM | POA: Diagnosis not present

## 2017-06-30 DIAGNOSIS — Z85828 Personal history of other malignant neoplasm of skin: Secondary | ICD-10-CM | POA: Diagnosis not present

## 2017-06-30 DIAGNOSIS — L738 Other specified follicular disorders: Secondary | ICD-10-CM | POA: Diagnosis not present

## 2017-09-16 DIAGNOSIS — Z85828 Personal history of other malignant neoplasm of skin: Secondary | ICD-10-CM | POA: Diagnosis not present

## 2017-09-16 DIAGNOSIS — L738 Other specified follicular disorders: Secondary | ICD-10-CM | POA: Diagnosis not present

## 2017-12-03 ENCOUNTER — Emergency Department (HOSPITAL_COMMUNITY)
Admission: EM | Admit: 2017-12-03 | Discharge: 2017-12-03 | Disposition: A | Discharge status: Home / Self Care | Payer: BLUE CROSS/BLUE SHIELD | Attending: Physician Assistant | Admitting: Physician Assistant

## 2017-12-03 ENCOUNTER — Emergency Department (HOSPITAL_COMMUNITY): Payer: BLUE CROSS/BLUE SHIELD

## 2017-12-03 ENCOUNTER — Other Ambulatory Visit: Payer: Self-pay

## 2017-12-03 ENCOUNTER — Encounter (HOSPITAL_COMMUNITY): Payer: Self-pay | Admitting: *Deleted

## 2017-12-03 DIAGNOSIS — Z79899 Other long term (current) drug therapy: Secondary | ICD-10-CM | POA: Insufficient documentation

## 2017-12-03 DIAGNOSIS — R079 Chest pain, unspecified: Secondary | ICD-10-CM | POA: Diagnosis not present

## 2017-12-03 DIAGNOSIS — R0789 Other chest pain: Secondary | ICD-10-CM | POA: Diagnosis not present

## 2017-12-03 DIAGNOSIS — R1013 Epigastric pain: Secondary | ICD-10-CM | POA: Diagnosis not present

## 2017-12-03 LAB — BASIC METABOLIC PANEL
Anion gap: 7 (ref 5–15)
BUN: 10 mg/dL (ref 6–20)
CO2: 25 mmol/L (ref 22–32)
Calcium: 8.9 mg/dL (ref 8.9–10.3)
Chloride: 106 mmol/L (ref 101–111)
Creatinine, Ser: 0.8 mg/dL (ref 0.44–1.00)
GFR calc Af Amer: >60 mL/min (ref >60)
GFR calc non Af Amer: >60 mL/min (ref >60)
Glucose, Bld: 101 mg/dL — ABNORMAL HIGH (ref 65–99)
Potassium: 4.4 mmol/L (ref 3.5–5.1)
Sodium: 138 mmol/L (ref 135–145)

## 2017-12-03 LAB — CBC
HCT: 42.7 % (ref 36.0–46.0)
Hemoglobin: 13.9 g/dL (ref 12.0–15.0)
MCH: 30.5 pg (ref 26.0–34.0)
MCHC: 32.6 g/dL (ref 30.0–36.0)
MCV: 93.8 fL (ref 78.0–100.0)
Platelets: 159 10*3/uL (ref 150–400)
RBC: 4.55 MIL/uL (ref 3.87–5.11)
RDW: 12.1 % (ref 11.5–15.5)
WBC: 3.7 10*3/uL — ABNORMAL LOW (ref 4.0–10.5)

## 2017-12-03 LAB — I-STAT TROPONIN, ED
Troponin i, poc: 0 ng/mL (ref 0.00–0.08)
Troponin i, poc: 0 ng/mL (ref 0.00–0.08)

## 2017-12-03 LAB — I-STAT BETA HCG BLOOD, ED (MC, WL, AP ONLY): I-stat hCG, quantitative: <5 m[IU]/mL (ref <5)

## 2017-12-03 MED ORDER — OMEPRAZOLE 20 MG PO CPDR: 20.0000 mg | DELAYED_RELEASE_CAPSULE | Freq: Two times a day (BID) | ORAL | 0 refills | Status: DC

## 2017-12-03 MED ORDER — RANITIDINE HCL 150 MG PO CAPS: 150.0000 mg | ORAL_CAPSULE | Freq: Two times a day (BID) | ORAL | 0 refills | Status: DC

## 2017-12-03 NOTE — Discharge Instructions (Signed)
Please take prilosec and zantac 30 minutes before each major meal for the next 2 weeks as your pain may be related to gastritis.  Follow up with your doctor for further evaluation.  Return if your condition worsen or if you have any concerns.

## 2017-12-03 NOTE — ED Provider Notes (Signed)
[facility] EMERGENCY DEPARTMENT Provider Note   CSN: [id number] Arrival date & time: 12/03/17  4098     History   Chief Complaint Chief Complaint  Patient presents with  . Chest Pain    HPI Destiny [name] is a 57 y.o. female.  HPI   57 year old female with history of dyspepsia presenting for evaluation of chest pain.  Patient report for the past 2 days she has had intermittent chest discomfort.  She described as a sharp stabbing sensation to her mid chest epigastric region, usually appears at nighttime waking up at night, occasionally radiates to her back and lasting sometimes seconds to minutes.  Last episode was earlier the day.  There is no associated lightheadedness, dizziness, nausea, diaphoresis or shortness of breath with this pain.  Nothing seems to make the pain better or worse.  She did try some Tums with minimal improvement.  No report of fever, chills, productive cough, hemoptysis.  No change in her diet.  Patient states she is very active, and exercise regularly and denies any exertional chest pain.  No prior history of PE or DVT, no recent surgery, prolonged bed rest, active cancer, leg swelling or calf pain.  She has not had any provocative cardiac testing the past.  She is not a smoker.  She does have family history of cardiac disease.  Currently she is without any discomfort.  She did reach out to her PCP who recommend patient to come to the ER for further evaluation of the chest pain.  Past Medical History:  Diagnosis Date  . Environmental allergies    trees and pollen    Patient Active Problem List   Diagnosis Date Noted  . DYSPEPSIA 04/19/2008  . DIARRHEA 04/19/2008    Past Surgical History:  Procedure Laterality Date  . ROTATOR CUFF REPAIR     March 2015 right     OB History   None      Home Medications    Prior to Admission medications   Medication Sig Start Date End Date Taking? Authorizing Provider  Omega-3 Fatty Acids  (FISH OIL) 1200 MG CPDR Take by mouth.    [provider]  OVER THE COUNTER MEDICATION Multivitamin with greens-purchased at [facility]    [provider]  OVER THE COUNTER MEDICATION Calcium + D with magnesium    [provider]  vitamin C (ASCORBIC ACID) 500 MG tablet Take 500 mg by mouth daily.    [provider]    Family History Family History  Problem Relation Age of Onset  . Colon polyps Brother   . Colon cancer Neg Hx   . Pancreatic cancer Neg Hx   . Rectal cancer Neg Hx   . Stomach cancer Neg Hx     Social History Social History   Tobacco Use  . Smoking status: Never Smoker  . Smokeless tobacco: Never Used  Substance Use Topics  . Alcohol use: Yes    Alcohol/week: 1.2 - 2.4 oz    Types: 2 - 4 Glasses of wine per week  . Drug use: No     Allergies   Patient has no known allergies.   Review of Systems Review of Systems  All other systems reviewed and are negative.    Physical Exam Updated Vital Signs BP 140/71 (BP Location: Right Arm)   Pulse 72   Temp 98.2 F (36.8 C) (Oral)   Resp 14   SpO2 99%   Physical Exam  Constitutional:  She appears well-developed and well-nourished. No distress.  HENT:  Head: Atraumatic.  Eyes: Conjunctivae are normal.  Neck: Neck supple.  Cardiovascular: Normal rate, regular rhythm and normal pulses.  Pulmonary/Chest: Effort normal and breath sounds normal. She has no decreased breath sounds. She has no wheezes. She has no rales.  Abdominal: Soft. There is no tenderness.  Musculoskeletal: Normal range of motion.       Right lower leg: She exhibits no edema.       Left lower leg: She exhibits no edema.  Neurological: She is alert.  Skin: No rash noted.  Psychiatric: She has a normal mood and affect.  Nursing note and vitals reviewed.    ED Treatments / Results  Labs (all labs ordered are listed, but only abnormal results are displayed) Labs Reviewed  BASIC METABOLIC PANEL -  Abnormal; Notable for the following components:      Result Value   Glucose, Bld 101 (*)    All other components within normal limits  CBC - Abnormal; Notable for the following components:   WBC 3.7 (*)    All other components within normal limits  I-STAT TROPONIN, ED  I-STAT BETA HCG BLOOD, ED ([facility], [facility], [facility] ONLY)    EKG EKG Interpretation  Date/Time:  Thursday December 03 2017 08:36:07 EDT Ventricular Rate:  68 PR Interval:  142 QRS Duration: 84 QT Interval:  412 QTC Calculation: 438 R Axis:   74 Text Interpretation:  Normal sinus rhythm Nonspecific ST abnormality Abnormal ECG Normal sinus rhythm Confirmed by Corlis LeakMackuen, Courteney ([id number]) on 12/03/2017 1:38:38 PM   Radiology Dg Chest 2 View  Result Date: 12/03/2017 CLINICAL DATA:  Chest pain for several days, initial encounter EXAM: CHEST - 2 VIEW COMPARISON:  None. FINDINGS: The heart size and mediastinal contours are within normal limits. Both lungs are clear. The visualized skeletal structures are unremarkable. IMPRESSION: No active cardiopulmonary disease. Electronically Signed   By: Alcide CleverMark  Lukens M.D.   On: 12/03/2017 09:29    Procedures Procedures (including critical care time)  Medications Ordered in ED Medications - No data to display   Initial Impression / Assessment and Plan / ED Course  I have reviewed the triage vital signs and the nursing notes.  Pertinent labs & imaging results that were available during my care of the patient were reviewed by me and considered in my medical decision making (see chart for details).     BP 123/74   Pulse 69   Temp 98.2 F (36.8 C) (Oral)   Resp 19   SpO2 100%    Final Clinical Impressions(s) / ED Diagnoses   Final diagnoses:  Epigastric pain    ED Discharge Orders        Ordered    omeprazole (PRILOSEC) 20 MG capsule  2 times daily before meals     12/03/17 1448    ranitidine (ZANTAC) 150 MG capsule  2 times daily     12/03/17 1448     2:37 PM Patient here with  recurrent midsternal/epigastric chest pain.  Pain is atypical for ACS.  Her heart score is 1 which put her at low risk for MACE.  Low suspicion of PE based on Wells criteria.  Pain is likely gastritis.  Work-up has been unremarkable, normal delta troponin.  Anticipate discharge with H2 blocker and a PPI medication.  Encourage patient to follow-up outpatient for further evaluation of her condition.  Care discussed with Dr. Corlis LeakMacKuen.  2:46 PM Normal delta trop.  Pt remains symptom  free.  Stable for discharge with outpt f/u .  Return precaution discussed.    Fayrene Helper, PA-C 12/03/17 1450    Mackuen, Cindee Salt, MD 12/09/17 1504

## 2017-12-03 NOTE — ED Triage Notes (Addendum)
To ED via POV for eval of CP over that started yesterday. Intermittent. States she was belching so she took tums without relief. No pain at present. Describes pain as sharp and radiating to back. Nothing ignites pain and it only lasts a few seconds. Denies N/V/D. Appears in nad

## 2017-12-08 ENCOUNTER — Telehealth: Payer: Self-pay | Admitting: Gastroenterology

## 2017-12-08 DIAGNOSIS — Z6821 Body mass index (BMI) 21.0-21.9, adult: Secondary | ICD-10-CM | POA: Diagnosis not present

## 2017-12-08 DIAGNOSIS — K219 Gastro-esophageal reflux disease without esophagitis: Secondary | ICD-10-CM | POA: Diagnosis not present

## 2017-12-08 DIAGNOSIS — K2970 Gastritis, unspecified, without bleeding: Secondary | ICD-10-CM | POA: Diagnosis not present

## 2017-12-08 NOTE — Telephone Encounter (Signed)
The pt states her symptoms are not severe or bothersome and will take the appt for 6/24.  She will call if symptoms worsen she will call back.

## 2017-12-18 DIAGNOSIS — R0789 Other chest pain: Secondary | ICD-10-CM | POA: Diagnosis not present

## 2017-12-18 DIAGNOSIS — Z23 Encounter for immunization: Secondary | ICD-10-CM | POA: Diagnosis not present

## 2017-12-18 DIAGNOSIS — L719 Rosacea, unspecified: Secondary | ICD-10-CM | POA: Diagnosis not present

## 2017-12-18 DIAGNOSIS — Z0000 Encounter for general adult medical examination without abnormal findings: Secondary | ICD-10-CM | POA: Diagnosis not present

## 2017-12-18 DIAGNOSIS — K219 Gastro-esophageal reflux disease without esophagitis: Secondary | ICD-10-CM | POA: Diagnosis not present

## 2017-12-18 DIAGNOSIS — M859 Disorder of bone density and structure, unspecified: Secondary | ICD-10-CM | POA: Diagnosis not present

## 2017-12-18 DIAGNOSIS — Z1389 Encounter for screening for other disorder: Secondary | ICD-10-CM | POA: Diagnosis not present

## 2017-12-21 DIAGNOSIS — Z85828 Personal history of other malignant neoplasm of skin: Secondary | ICD-10-CM | POA: Diagnosis not present

## 2017-12-21 DIAGNOSIS — D225 Melanocytic nevi of trunk: Secondary | ICD-10-CM | POA: Diagnosis not present

## 2017-12-21 DIAGNOSIS — L738 Other specified follicular disorders: Secondary | ICD-10-CM | POA: Diagnosis not present

## 2017-12-21 DIAGNOSIS — D2261 Melanocytic nevi of right upper limb, including shoulder: Secondary | ICD-10-CM | POA: Diagnosis not present

## 2018-02-01 ENCOUNTER — Ambulatory Visit (INDEPENDENT_AMBULATORY_CARE_PROVIDER_SITE_OTHER): Payer: BLUE CROSS/BLUE SHIELD | Admitting: Gastroenterology

## 2018-02-01 ENCOUNTER — Encounter: Payer: Self-pay | Admitting: Gastroenterology

## 2018-02-01 ENCOUNTER — Other Ambulatory Visit (INDEPENDENT_AMBULATORY_CARE_PROVIDER_SITE_OTHER): Payer: BLUE CROSS/BLUE SHIELD

## 2018-02-01 VITALS — BP 120/60 | HR 60 | Ht 63.0 in | Wt 123.0 lb

## 2018-02-01 DIAGNOSIS — Z1151 Encounter for screening for human papillomavirus (HPV): Secondary | ICD-10-CM | POA: Diagnosis not present

## 2018-02-01 DIAGNOSIS — Z01419 Encounter for gynecological examination (general) (routine) without abnormal findings: Secondary | ICD-10-CM | POA: Diagnosis not present

## 2018-02-01 DIAGNOSIS — Z6822 Body mass index (BMI) 22.0-22.9, adult: Secondary | ICD-10-CM | POA: Diagnosis not present

## 2018-02-01 DIAGNOSIS — R1013 Epigastric pain: Secondary | ICD-10-CM

## 2018-02-01 LAB — CBC WITH DIFFERENTIAL/PLATELET
Basophils Absolute: 0 10*3/uL (ref 0.0–0.1)
Basophils Relative: 0.6 % (ref 0.0–3.0)
Eosinophils Absolute: 0 10*3/uL (ref 0.0–0.7)
Eosinophils Relative: 0.8 % (ref 0.0–5.0)
HCT: 39.5 % (ref 36.0–46.0)
Hemoglobin: 13.5 g/dL (ref 12.0–15.0)
Lymphocytes Relative: 20.6 % (ref 12.0–46.0)
Lymphs Abs: 1.2 10*3/uL (ref 0.7–4.0)
MCHC: 34.3 g/dL (ref 30.0–36.0)
MCV: 92.9 fl (ref 78.0–100.0)
Monocytes Absolute: 0.5 10*3/uL (ref 0.1–1.0)
Monocytes Relative: 8.3 % (ref 3.0–12.0)
Neutro Abs: 4.1 10*3/uL (ref 1.4–7.7)
Neutrophils Relative %: 69.7 % (ref 43.0–77.0)
Platelets: 154 10*3/uL (ref 150.0–400.0)
RBC: 4.25 Mil/uL (ref 3.87–5.11)
RDW: 12.4 % (ref 11.5–15.5)
WBC: 5.9 10*3/uL (ref 4.0–10.5)

## 2018-02-01 LAB — IGA: IgA: 173 mg/dL (ref 68–378)

## 2018-02-01 NOTE — Progress Notes (Signed)
Review of pertinent gastrointestinal problems: 1. Adenomatous polyps: Colonoscopy Dr. Christella Hartigan 2010 4mm adenomatous polyp.  Colonoscopy Dr. Christella Hartigan 2015 normal, no polyps. Recommended recall at 10 year interval. 2. Dyspepsia from H. Pylori  EGD September 2009 showed mild gastritis. Biopsies confirmed H. pylori, she was treated with appropriate antibiotics.  Dyspepsia improved after antibiotics.   HPI: This is a  very pleasant 57 year old woman whom I last saw 2 or 3 years ago the time of a colonoscopy.  Chief complaint is chest pain, belching, bloating 2 months ago she had Chest discomfort with some associated mild shortness of breath that awoke her from sleep 2 or 3 nights in a row.  She eventually presented to the emergency room and was ruled out for cardiac issues.  She was given proton pump inhibitor and H2 blocker.  She completed 4 weeks or so of these and in the past week to 2 weeks she has been completely off of antiacid medicines.  She feels mostly back to normal but she still has a lot of belching, multiple belches 80 times per day.  Her stools tend to be generally a bit soft.  Her weight is overall stable No overt GI bleeding   Labs 11/2017: cbc, bmet, trop, bhcg all normal. CRX 11/2017: normal.   ROS: complete GI ROS as described in HPI, all other review negative.  Constitutional:  No unintentional weight loss   Past Medical History:  Diagnosis Date  . Environmental allergies    trees and pollen    Past Surgical History:  Procedure Laterality Date  . ROTATOR CUFF REPAIR     March 2015 right    Current Outpatient Medications  Medication Sig Dispense Refill  . estradiol (VIVELLE-DOT) 0.05 MG/24HR patch Place 1 patch onto the skin every 3 (three) days.  12  . Omega-3 Fatty Acids (FISH OIL) 1200 MG CPDR Take by mouth.    Marland Kitchen OVER THE COUNTER MEDICATION Calcium + D with magnesium    . progesterone (PROMETRIUM) 100 MG capsule Take 100 mg by mouth at bedtime.  12  . vitamin  B-12 (CYANOCOBALAMIN) 100 MCG tablet Take 100 mcg by mouth daily. 2 tablets daily     No current facility-administered medications for this visit.     Allergies as of 02/01/2018 - Review Complete 02/01/2018  Allergen Reaction Noted  . Doxycycline Rash 12/23/2016  . Sulfa antibiotics Rash 12/03/2017    Family History  Problem Relation Age of Onset  . Colon polyps Brother   . Colon cancer Neg Hx   . Pancreatic cancer Neg Hx   . Rectal cancer Neg Hx   . Stomach cancer Neg Hx     Social History   Socioeconomic History  . Marital status: Married    Spouse name: Not on file  . Number of children: Not on file  . Years of education: Not on file  . Highest education level: Not on file  Occupational History  . Not on file  Social Needs  . Financial resource strain: Not on file  . Food insecurity:    Worry: Not on file    Inability: Not on file  . Transportation needs:    Medical: Not on file    Non-medical: Not on file  Tobacco Use  . Smoking status: Never Smoker  . Smokeless tobacco: Never Used  Substance and Sexual Activity  . Alcohol use: Yes    Alcohol/week: 1.2 - 2.4 oz    Types: 2 - 4 Glasses of wine per  week  . Drug use: No  . Sexual activity: Not on file  Lifestyle  . Physical activity:    Days per week: Not on file    Minutes per session: Not on file  . Stress: Not on file  Relationships  . Social connections:    Talks on phone: Not on file    Gets together: Not on file    Attends religious service: Not on file    Active member of club or organization: Not on file    Attends meetings of clubs or organizations: Not on file    Relationship status: Not on file  . Intimate partner violence:    Fear of current or ex partner: Not on file    Emotionally abused: Not on file    Physically abused: Not on file    Forced sexual activity: Not on file  Other Topics Concern  . Not on file  Social History Narrative  . Not on file     Physical Exam: BP 120/60    Pulse 60   Ht 5\' 3"  (1.6 m)   Wt 123 lb (55.8 kg)   BMI 21.79 kg/m  Constitutional: generally well-appearing Psychiatric: alert and oriented x3 Abdomen: soft, nontender, nondistended, no obvious ascites, no peritoneal signs, normal bowel sounds No peripheral edema noted in lower extremities  Assessment and plan: 57 y.o. female with improved chest pains, dyspepsia, loose stools, somewhat GERD-like  I did not mention above but a lot of her symptoms tend to be when she is flat or laying down in bed.  This points towards acid as a potential etiology.  I recommend she start taking ranitidine 150 mg 1 pill at bedtime every night.  Also going to check her again for celiac sprue with a tissue transglutaminase acid and total IgA level.  She has had H pylori eradicated 10 years ago and I will get stool antigen as well to see if that is recurrent or residual.  Please see the "Patient Instructions" section for addition details about the plan.  Rob Buntinganiel Jacobs, MD [facility] 02/01/2018, 2:01 PM

## 2018-02-01 NOTE — Patient Instructions (Addendum)
You will have labs checked today in the basement lab.  Please head down after you check out with the front desk  (tTG, total IgA level, cbc, stool for H. Pylori antigen). Start ranitidine 150mg  pills, one pill at bedtime nightly.  Normal BMI (Body Mass Index- based on height and weight) is between 19 and 25. Your BMI today is Body mass index is 21.79 kg/m. Marland Kitchen. Please consider follow up  regarding your BMI with your Primary Care Provider.

## 2018-02-02 ENCOUNTER — Other Ambulatory Visit: Payer: BLUE CROSS/BLUE SHIELD

## 2018-02-02 DIAGNOSIS — R1013 Epigastric pain: Secondary | ICD-10-CM | POA: Diagnosis not present

## 2018-02-02 LAB — TISSUE TRANSGLUTAMINASE, IGA: (tTG) Ab, IgA: 1 U/mL

## 2018-02-03 LAB — HELICOBACTER PYLORI  SPECIAL ANTIGEN
MICRO NUMBER:: 90757670
SPECIMEN QUALITY: ADEQUATE

## 2018-04-21 DIAGNOSIS — Z85828 Personal history of other malignant neoplasm of skin: Secondary | ICD-10-CM | POA: Diagnosis not present

## 2018-04-21 DIAGNOSIS — L570 Actinic keratosis: Secondary | ICD-10-CM | POA: Diagnosis not present

## 2018-04-21 DIAGNOSIS — L820 Inflamed seborrheic keratosis: Secondary | ICD-10-CM | POA: Diagnosis not present

## 2018-04-21 DIAGNOSIS — L720 Epidermal cyst: Secondary | ICD-10-CM | POA: Diagnosis not present

## 2018-05-25 DIAGNOSIS — Z23 Encounter for immunization: Secondary | ICD-10-CM | POA: Diagnosis not present

## 2018-08-17 DIAGNOSIS — M9902 Segmental and somatic dysfunction of thoracic region: Secondary | ICD-10-CM | POA: Diagnosis not present

## 2018-08-17 DIAGNOSIS — M9901 Segmental and somatic dysfunction of cervical region: Secondary | ICD-10-CM | POA: Diagnosis not present

## 2018-08-17 DIAGNOSIS — M9907 Segmental and somatic dysfunction of upper extremity: Secondary | ICD-10-CM | POA: Diagnosis not present

## 2018-08-17 DIAGNOSIS — M7542 Impingement syndrome of left shoulder: Secondary | ICD-10-CM | POA: Diagnosis not present

## 2018-08-19 DIAGNOSIS — M9901 Segmental and somatic dysfunction of cervical region: Secondary | ICD-10-CM | POA: Diagnosis not present

## 2018-08-19 DIAGNOSIS — M9902 Segmental and somatic dysfunction of thoracic region: Secondary | ICD-10-CM | POA: Diagnosis not present

## 2018-08-19 DIAGNOSIS — M7542 Impingement syndrome of left shoulder: Secondary | ICD-10-CM | POA: Diagnosis not present

## 2018-08-19 DIAGNOSIS — M9907 Segmental and somatic dysfunction of upper extremity: Secondary | ICD-10-CM | POA: Diagnosis not present

## 2018-08-24 DIAGNOSIS — M9902 Segmental and somatic dysfunction of thoracic region: Secondary | ICD-10-CM | POA: Diagnosis not present

## 2018-08-24 DIAGNOSIS — M7542 Impingement syndrome of left shoulder: Secondary | ICD-10-CM | POA: Diagnosis not present

## 2018-08-24 DIAGNOSIS — M9901 Segmental and somatic dysfunction of cervical region: Secondary | ICD-10-CM | POA: Diagnosis not present

## 2018-08-24 DIAGNOSIS — M9907 Segmental and somatic dysfunction of upper extremity: Secondary | ICD-10-CM | POA: Diagnosis not present

## 2018-08-26 DIAGNOSIS — M7542 Impingement syndrome of left shoulder: Secondary | ICD-10-CM | POA: Diagnosis not present

## 2018-08-26 DIAGNOSIS — M859 Disorder of bone density and structure, unspecified: Secondary | ICD-10-CM | POA: Diagnosis not present

## 2018-08-26 DIAGNOSIS — M9901 Segmental and somatic dysfunction of cervical region: Secondary | ICD-10-CM | POA: Diagnosis not present

## 2018-08-26 DIAGNOSIS — M9902 Segmental and somatic dysfunction of thoracic region: Secondary | ICD-10-CM | POA: Diagnosis not present

## 2018-08-26 DIAGNOSIS — M9907 Segmental and somatic dysfunction of upper extremity: Secondary | ICD-10-CM | POA: Diagnosis not present

## 2018-08-31 DIAGNOSIS — M7542 Impingement syndrome of left shoulder: Secondary | ICD-10-CM | POA: Diagnosis not present

## 2018-08-31 DIAGNOSIS — M9907 Segmental and somatic dysfunction of upper extremity: Secondary | ICD-10-CM | POA: Diagnosis not present

## 2018-08-31 DIAGNOSIS — M9902 Segmental and somatic dysfunction of thoracic region: Secondary | ICD-10-CM | POA: Diagnosis not present

## 2018-08-31 DIAGNOSIS — M9901 Segmental and somatic dysfunction of cervical region: Secondary | ICD-10-CM | POA: Diagnosis not present

## 2018-09-02 DIAGNOSIS — M7542 Impingement syndrome of left shoulder: Secondary | ICD-10-CM | POA: Diagnosis not present

## 2018-09-02 DIAGNOSIS — M9907 Segmental and somatic dysfunction of upper extremity: Secondary | ICD-10-CM | POA: Diagnosis not present

## 2018-09-02 DIAGNOSIS — M9901 Segmental and somatic dysfunction of cervical region: Secondary | ICD-10-CM | POA: Diagnosis not present

## 2018-09-02 DIAGNOSIS — M9902 Segmental and somatic dysfunction of thoracic region: Secondary | ICD-10-CM | POA: Diagnosis not present

## 2018-09-07 DIAGNOSIS — M7542 Impingement syndrome of left shoulder: Secondary | ICD-10-CM | POA: Diagnosis not present

## 2018-09-07 DIAGNOSIS — M7712 Lateral epicondylitis, left elbow: Secondary | ICD-10-CM | POA: Diagnosis not present

## 2018-09-07 DIAGNOSIS — M9901 Segmental and somatic dysfunction of cervical region: Secondary | ICD-10-CM | POA: Diagnosis not present

## 2018-09-07 DIAGNOSIS — M9902 Segmental and somatic dysfunction of thoracic region: Secondary | ICD-10-CM | POA: Diagnosis not present

## 2018-09-09 DIAGNOSIS — M9901 Segmental and somatic dysfunction of cervical region: Secondary | ICD-10-CM | POA: Diagnosis not present

## 2018-09-09 DIAGNOSIS — M7542 Impingement syndrome of left shoulder: Secondary | ICD-10-CM | POA: Diagnosis not present

## 2018-09-09 DIAGNOSIS — M7712 Lateral epicondylitis, left elbow: Secondary | ICD-10-CM | POA: Diagnosis not present

## 2018-09-09 DIAGNOSIS — M9902 Segmental and somatic dysfunction of thoracic region: Secondary | ICD-10-CM | POA: Diagnosis not present

## 2018-09-16 DIAGNOSIS — M7712 Lateral epicondylitis, left elbow: Secondary | ICD-10-CM | POA: Diagnosis not present

## 2018-09-16 DIAGNOSIS — M9901 Segmental and somatic dysfunction of cervical region: Secondary | ICD-10-CM | POA: Diagnosis not present

## 2018-09-16 DIAGNOSIS — M7542 Impingement syndrome of left shoulder: Secondary | ICD-10-CM | POA: Diagnosis not present

## 2018-09-16 DIAGNOSIS — M9902 Segmental and somatic dysfunction of thoracic region: Secondary | ICD-10-CM | POA: Diagnosis not present

## 2018-09-24 DIAGNOSIS — M9902 Segmental and somatic dysfunction of thoracic region: Secondary | ICD-10-CM | POA: Diagnosis not present

## 2018-09-24 DIAGNOSIS — M7542 Impingement syndrome of left shoulder: Secondary | ICD-10-CM | POA: Diagnosis not present

## 2018-09-24 DIAGNOSIS — M7712 Lateral epicondylitis, left elbow: Secondary | ICD-10-CM | POA: Diagnosis not present

## 2018-09-24 DIAGNOSIS — M9901 Segmental and somatic dysfunction of cervical region: Secondary | ICD-10-CM | POA: Diagnosis not present

## 2018-10-05 DIAGNOSIS — M9901 Segmental and somatic dysfunction of cervical region: Secondary | ICD-10-CM | POA: Diagnosis not present

## 2018-10-05 DIAGNOSIS — M7712 Lateral epicondylitis, left elbow: Secondary | ICD-10-CM | POA: Diagnosis not present

## 2018-10-05 DIAGNOSIS — M9902 Segmental and somatic dysfunction of thoracic region: Secondary | ICD-10-CM | POA: Diagnosis not present

## 2018-10-05 DIAGNOSIS — M7542 Impingement syndrome of left shoulder: Secondary | ICD-10-CM | POA: Diagnosis not present

## 2018-12-16 DIAGNOSIS — Z0000 Encounter for general adult medical examination without abnormal findings: Secondary | ICD-10-CM | POA: Diagnosis not present

## 2018-12-16 DIAGNOSIS — M859 Disorder of bone density and structure, unspecified: Secondary | ICD-10-CM | POA: Diagnosis not present

## 2018-12-23 DIAGNOSIS — Z0000 Encounter for general adult medical examination without abnormal findings: Secondary | ICD-10-CM | POA: Diagnosis not present

## 2018-12-23 DIAGNOSIS — M8580 Other specified disorders of bone density and structure, unspecified site: Secondary | ICD-10-CM | POA: Diagnosis not present

## 2018-12-23 DIAGNOSIS — K219 Gastro-esophageal reflux disease without esophagitis: Secondary | ICD-10-CM | POA: Diagnosis not present

## 2018-12-23 DIAGNOSIS — Z1331 Encounter for screening for depression: Secondary | ICD-10-CM | POA: Diagnosis not present

## 2018-12-23 DIAGNOSIS — F0789 Other personality and behavioral disorders due to known physiological condition: Secondary | ICD-10-CM | POA: Diagnosis not present

## 2018-12-23 DIAGNOSIS — M25511 Pain in right shoulder: Secondary | ICD-10-CM | POA: Diagnosis not present

## 2019-02-09 ENCOUNTER — Ambulatory Visit (INDEPENDENT_AMBULATORY_CARE_PROVIDER_SITE_OTHER): Payer: BC Managed Care – PPO | Admitting: Podiatry

## 2019-02-09 ENCOUNTER — Ambulatory Visit (INDEPENDENT_AMBULATORY_CARE_PROVIDER_SITE_OTHER): Payer: BC Managed Care – PPO

## 2019-02-09 ENCOUNTER — Encounter: Payer: Self-pay | Admitting: Podiatry

## 2019-02-09 ENCOUNTER — Other Ambulatory Visit: Payer: Self-pay | Admitting: Podiatry

## 2019-02-09 ENCOUNTER — Other Ambulatory Visit: Payer: Self-pay

## 2019-02-09 VITALS — Temp 97.6°F

## 2019-02-09 DIAGNOSIS — M722 Plantar fascial fibromatosis: Secondary | ICD-10-CM

## 2019-02-09 DIAGNOSIS — M79671 Pain in right foot: Secondary | ICD-10-CM

## 2019-02-09 NOTE — Progress Notes (Signed)
   Subjective:    Patient ID: Destiny [name], female    DOB: [birth year], 58 y.o.   MRN: [id number]  HPI    Review of Systems  All other systems reviewed and are negative.      Objective:   Physical Exam        Assessment & Plan:

## 2019-02-10 NOTE — Progress Notes (Signed)
Subjective:   Patient ID: Destiny [name], female   DOB: 58 y.o.   MRN: [id number]   HPI Patient presents with a small nodule the plantar aspect right arch that she is known has been there but seems to have grown and has become mildly tender.  States that it seems to have grown over just the last month or 2 and patient does not smoke likes to be active   Review of Systems  All other systems reviewed and are negative.       Objective:  Physical Exam Vitals signs and nursing note reviewed.  Constitutional:      Appearance: She is well-developed.  Pulmonary:     Effort: Pulmonary effort is normal.  Musculoskeletal: Normal range of motion.  Skin:    General: Skin is warm.  Neurological:     Mental Status: She is alert.     Neurovascular status intact muscle strength [name] to be adequate range of motion within normal limits.  Patient is noted to have a small nodule measuring about 1 x 1 cm in the plantar aspect of the right arch that upon deep palpation is mildly tender.  Patient has good digital perfusion well oriented x3     Assessment:  Probability for plantar fibroma with mild fasciitis symptoms right with other unknown cyst be possible due to growth     Plan:  H&P educated patient discussed excision versus trying to shrink it and she is opted for shrinking it understanding we do not know exactly what it is but most likely it is a benign type lesion.  Today I did sterile prep and I injected the mid fascia 3 mg Kenalog 5 mg Xylocaine and advised on hot compresses and if symptoms persist or it were to grow and this will need to be excised  X-rays indicate that there is no signs of calcification associated with this lesion

## 2019-02-15 DIAGNOSIS — L821 Other seborrheic keratosis: Secondary | ICD-10-CM | POA: Diagnosis not present

## 2019-02-15 DIAGNOSIS — D2272 Melanocytic nevi of left lower limb, including hip: Secondary | ICD-10-CM | POA: Diagnosis not present

## 2019-02-15 DIAGNOSIS — Z85828 Personal history of other malignant neoplasm of skin: Secondary | ICD-10-CM | POA: Diagnosis not present

## 2019-02-15 DIAGNOSIS — D2261 Melanocytic nevi of right upper limb, including shoulder: Secondary | ICD-10-CM | POA: Diagnosis not present

## 2019-02-15 DIAGNOSIS — E7849 Other hyperlipidemia: Secondary | ICD-10-CM | POA: Diagnosis not present

## 2019-02-15 DIAGNOSIS — L570 Actinic keratosis: Secondary | ICD-10-CM | POA: Diagnosis not present

## 2019-04-19 DIAGNOSIS — Z01419 Encounter for gynecological examination (general) (routine) without abnormal findings: Secondary | ICD-10-CM | POA: Diagnosis not present

## 2019-04-19 DIAGNOSIS — Z6822 Body mass index (BMI) 22.0-22.9, adult: Secondary | ICD-10-CM | POA: Diagnosis not present

## 2019-04-19 DIAGNOSIS — Z1231 Encounter for screening mammogram for malignant neoplasm of breast: Secondary | ICD-10-CM | POA: Diagnosis not present

## 2019-04-26 DIAGNOSIS — Z23 Encounter for immunization: Secondary | ICD-10-CM | POA: Diagnosis not present

## 2019-05-23 DIAGNOSIS — Z20828 Contact with and (suspected) exposure to other viral communicable diseases: Secondary | ICD-10-CM | POA: Diagnosis not present

## 2019-09-05 DIAGNOSIS — M79645 Pain in left finger(s): Secondary | ICD-10-CM | POA: Diagnosis not present

## 2019-09-05 DIAGNOSIS — S63631A Sprain of interphalangeal joint of left index finger, initial encounter: Secondary | ICD-10-CM | POA: Insufficient documentation

## 2019-10-03 DIAGNOSIS — M25572 Pain in left ankle and joints of left foot: Secondary | ICD-10-CM | POA: Diagnosis not present

## 2019-10-04 DIAGNOSIS — M25572 Pain in left ankle and joints of left foot: Secondary | ICD-10-CM | POA: Diagnosis not present

## 2019-10-04 DIAGNOSIS — M7662 Achilles tendinitis, left leg: Secondary | ICD-10-CM | POA: Diagnosis not present

## 2019-10-05 DIAGNOSIS — M79645 Pain in left finger(s): Secondary | ICD-10-CM | POA: Diagnosis not present

## 2019-10-05 DIAGNOSIS — M25642 Stiffness of left hand, not elsewhere classified: Secondary | ICD-10-CM | POA: Diagnosis not present

## 2019-10-05 DIAGNOSIS — S63631D Sprain of interphalangeal joint of left index finger, subsequent encounter: Secondary | ICD-10-CM | POA: Diagnosis not present

## 2019-10-05 DIAGNOSIS — M25442 Effusion, left hand: Secondary | ICD-10-CM | POA: Diagnosis not present

## 2019-10-07 DIAGNOSIS — M25572 Pain in left ankle and joints of left foot: Secondary | ICD-10-CM | POA: Diagnosis not present

## 2019-10-07 DIAGNOSIS — M7662 Achilles tendinitis, left leg: Secondary | ICD-10-CM | POA: Diagnosis not present

## 2019-10-10 DIAGNOSIS — M25572 Pain in left ankle and joints of left foot: Secondary | ICD-10-CM | POA: Diagnosis not present

## 2019-10-10 DIAGNOSIS — M7662 Achilles tendinitis, left leg: Secondary | ICD-10-CM | POA: Diagnosis not present

## 2019-10-13 DIAGNOSIS — B373 Candidiasis of vulva and vagina: Secondary | ICD-10-CM | POA: Diagnosis not present

## 2019-10-13 DIAGNOSIS — M25572 Pain in left ankle and joints of left foot: Secondary | ICD-10-CM | POA: Diagnosis not present

## 2019-10-13 DIAGNOSIS — M7662 Achilles tendinitis, left leg: Secondary | ICD-10-CM | POA: Diagnosis not present

## 2019-10-18 DIAGNOSIS — M25572 Pain in left ankle and joints of left foot: Secondary | ICD-10-CM | POA: Diagnosis not present

## 2019-10-18 DIAGNOSIS — M7662 Achilles tendinitis, left leg: Secondary | ICD-10-CM | POA: Diagnosis not present

## 2019-10-24 DIAGNOSIS — M25572 Pain in left ankle and joints of left foot: Secondary | ICD-10-CM | POA: Diagnosis not present

## 2019-10-24 DIAGNOSIS — M7662 Achilles tendinitis, left leg: Secondary | ICD-10-CM | POA: Diagnosis not present

## 2019-10-27 DIAGNOSIS — N898 Other specified noninflammatory disorders of vagina: Secondary | ICD-10-CM | POA: Diagnosis not present

## 2019-10-27 DIAGNOSIS — N9089 Other specified noninflammatory disorders of vulva and perineum: Secondary | ICD-10-CM | POA: Diagnosis not present

## 2019-10-27 DIAGNOSIS — M25572 Pain in left ankle and joints of left foot: Secondary | ICD-10-CM | POA: Diagnosis not present

## 2019-10-27 DIAGNOSIS — M7662 Achilles tendinitis, left leg: Secondary | ICD-10-CM | POA: Diagnosis not present

## 2019-10-31 DIAGNOSIS — M7662 Achilles tendinitis, left leg: Secondary | ICD-10-CM | POA: Diagnosis not present

## 2019-10-31 DIAGNOSIS — M25572 Pain in left ankle and joints of left foot: Secondary | ICD-10-CM | POA: Diagnosis not present

## 2019-11-04 DIAGNOSIS — M7662 Achilles tendinitis, left leg: Secondary | ICD-10-CM | POA: Diagnosis not present

## 2019-11-04 DIAGNOSIS — M25572 Pain in left ankle and joints of left foot: Secondary | ICD-10-CM | POA: Diagnosis not present

## 2019-11-15 DIAGNOSIS — M25572 Pain in left ankle and joints of left foot: Secondary | ICD-10-CM | POA: Diagnosis not present

## 2019-11-15 DIAGNOSIS — M7662 Achilles tendinitis, left leg: Secondary | ICD-10-CM | POA: Diagnosis not present

## 2019-11-17 DIAGNOSIS — M25572 Pain in left ankle and joints of left foot: Secondary | ICD-10-CM | POA: Diagnosis not present

## 2019-11-17 DIAGNOSIS — M7662 Achilles tendinitis, left leg: Secondary | ICD-10-CM | POA: Diagnosis not present

## 2019-11-21 DIAGNOSIS — M25572 Pain in left ankle and joints of left foot: Secondary | ICD-10-CM | POA: Diagnosis not present

## 2019-11-21 DIAGNOSIS — M7662 Achilles tendinitis, left leg: Secondary | ICD-10-CM | POA: Diagnosis not present

## 2019-11-28 DIAGNOSIS — M7662 Achilles tendinitis, left leg: Secondary | ICD-10-CM | POA: Diagnosis not present

## 2019-11-28 DIAGNOSIS — M25572 Pain in left ankle and joints of left foot: Secondary | ICD-10-CM | POA: Diagnosis not present

## 2019-12-27 DIAGNOSIS — M25572 Pain in left ankle and joints of left foot: Secondary | ICD-10-CM | POA: Diagnosis not present

## 2020-01-17 DIAGNOSIS — Z0000 Encounter for general adult medical examination without abnormal findings: Secondary | ICD-10-CM | POA: Diagnosis not present

## 2020-01-17 DIAGNOSIS — M859 Disorder of bone density and structure, unspecified: Secondary | ICD-10-CM | POA: Diagnosis not present

## 2020-01-17 DIAGNOSIS — E7849 Other hyperlipidemia: Secondary | ICD-10-CM | POA: Diagnosis not present

## 2020-01-24 ENCOUNTER — Other Ambulatory Visit: Payer: Self-pay | Admitting: Internal Medicine

## 2020-01-24 DIAGNOSIS — K219 Gastro-esophageal reflux disease without esophagitis: Secondary | ICD-10-CM | POA: Diagnosis not present

## 2020-01-24 DIAGNOSIS — Z0000 Encounter for general adult medical examination without abnormal findings: Secondary | ICD-10-CM | POA: Diagnosis not present

## 2020-01-24 DIAGNOSIS — Z1331 Encounter for screening for depression: Secondary | ICD-10-CM | POA: Diagnosis not present

## 2020-01-24 DIAGNOSIS — E785 Hyperlipidemia, unspecified: Secondary | ICD-10-CM | POA: Diagnosis not present

## 2020-01-24 DIAGNOSIS — M7660 Achilles tendinitis, unspecified leg: Secondary | ICD-10-CM | POA: Diagnosis not present

## 2020-01-24 DIAGNOSIS — M20009 Unspecified deformity of unspecified finger(s): Secondary | ICD-10-CM | POA: Diagnosis not present

## 2020-01-26 DIAGNOSIS — Z1212 Encounter for screening for malignant neoplasm of rectum: Secondary | ICD-10-CM | POA: Diagnosis not present

## 2020-01-31 DIAGNOSIS — M25572 Pain in left ankle and joints of left foot: Secondary | ICD-10-CM | POA: Diagnosis not present

## 2020-02-10 ENCOUNTER — Ambulatory Visit
Admission: RE | Admit: 2020-02-10 | Discharge: 2020-02-10 | Disposition: A | Discharge status: Home / Self Care | Payer: No Typology Code available for payment source | Source: Ambulatory Visit | Attending: Internal Medicine | Admitting: Internal Medicine

## 2020-02-10 DIAGNOSIS — E785 Hyperlipidemia, unspecified: Secondary | ICD-10-CM

## 2020-03-14 DIAGNOSIS — D2261 Melanocytic nevi of right upper limb, including shoulder: Secondary | ICD-10-CM | POA: Diagnosis not present

## 2020-03-14 DIAGNOSIS — D225 Melanocytic nevi of trunk: Secondary | ICD-10-CM | POA: Diagnosis not present

## 2020-03-14 DIAGNOSIS — Z85828 Personal history of other malignant neoplasm of skin: Secondary | ICD-10-CM | POA: Diagnosis not present

## 2020-03-14 DIAGNOSIS — L570 Actinic keratosis: Secondary | ICD-10-CM | POA: Diagnosis not present

## 2020-03-14 DIAGNOSIS — L820 Inflamed seborrheic keratosis: Secondary | ICD-10-CM | POA: Diagnosis not present

## 2020-03-14 DIAGNOSIS — L304 Erythema intertrigo: Secondary | ICD-10-CM | POA: Diagnosis not present

## 2020-03-23 DIAGNOSIS — Z20822 Contact with and (suspected) exposure to covid-19: Secondary | ICD-10-CM | POA: Diagnosis not present

## 2021-04-29 DIAGNOSIS — M859 Disorder of bone density and structure, unspecified: Secondary | ICD-10-CM | POA: Diagnosis not present

## 2021-04-29 DIAGNOSIS — E785 Hyperlipidemia, unspecified: Secondary | ICD-10-CM | POA: Diagnosis not present

## 2021-05-06 DIAGNOSIS — D72819 Decreased white blood cell count, unspecified: Secondary | ICD-10-CM | POA: Diagnosis not present

## 2021-05-06 DIAGNOSIS — Z23 Encounter for immunization: Secondary | ICD-10-CM | POA: Diagnosis not present

## 2021-05-06 DIAGNOSIS — R82998 Other abnormal findings in urine: Secondary | ICD-10-CM | POA: Diagnosis not present

## 2021-05-06 DIAGNOSIS — Z0000 Encounter for general adult medical examination without abnormal findings: Secondary | ICD-10-CM | POA: Diagnosis not present

## 2021-06-04 DIAGNOSIS — Z01419 Encounter for gynecological examination (general) (routine) without abnormal findings: Secondary | ICD-10-CM | POA: Diagnosis not present

## 2021-06-04 DIAGNOSIS — Z124 Encounter for screening for malignant neoplasm of cervix: Secondary | ICD-10-CM | POA: Diagnosis not present

## 2021-06-04 DIAGNOSIS — Z1231 Encounter for screening mammogram for malignant neoplasm of breast: Secondary | ICD-10-CM | POA: Diagnosis not present

## 2021-06-04 DIAGNOSIS — Z113 Encounter for screening for infections with a predominantly sexual mode of transmission: Secondary | ICD-10-CM | POA: Diagnosis not present

## 2021-06-04 DIAGNOSIS — Z6822 Body mass index (BMI) 22.0-22.9, adult: Secondary | ICD-10-CM | POA: Diagnosis not present

## 2021-06-24 DIAGNOSIS — Z85828 Personal history of other malignant neoplasm of skin: Secondary | ICD-10-CM | POA: Diagnosis not present

## 2021-06-24 DIAGNOSIS — L820 Inflamed seborrheic keratosis: Secondary | ICD-10-CM | POA: Diagnosis not present

## 2021-06-24 DIAGNOSIS — L570 Actinic keratosis: Secondary | ICD-10-CM | POA: Diagnosis not present

## 2021-07-16 DIAGNOSIS — C44729 Squamous cell carcinoma of skin of left lower limb, including hip: Secondary | ICD-10-CM | POA: Diagnosis not present

## 2021-08-20 DIAGNOSIS — F4323 Adjustment disorder with mixed anxiety and depressed mood: Secondary | ICD-10-CM | POA: Diagnosis not present

## 2021-08-27 DIAGNOSIS — F4323 Adjustment disorder with mixed anxiety and depressed mood: Secondary | ICD-10-CM | POA: Diagnosis not present

## 2021-09-04 DIAGNOSIS — F4323 Adjustment disorder with mixed anxiety and depressed mood: Secondary | ICD-10-CM | POA: Diagnosis not present

## 2021-09-11 DIAGNOSIS — F4323 Adjustment disorder with mixed anxiety and depressed mood: Secondary | ICD-10-CM | POA: Diagnosis not present

## 2021-09-18 DIAGNOSIS — F4323 Adjustment disorder with mixed anxiety and depressed mood: Secondary | ICD-10-CM | POA: Diagnosis not present

## 2021-09-27 DIAGNOSIS — F4323 Adjustment disorder with mixed anxiety and depressed mood: Secondary | ICD-10-CM | POA: Diagnosis not present

## 2021-10-02 DIAGNOSIS — F4323 Adjustment disorder with mixed anxiety and depressed mood: Secondary | ICD-10-CM | POA: Diagnosis not present

## 2021-10-09 DIAGNOSIS — F4323 Adjustment disorder with mixed anxiety and depressed mood: Secondary | ICD-10-CM | POA: Diagnosis not present

## 2021-10-17 DIAGNOSIS — F4323 Adjustment disorder with mixed anxiety and depressed mood: Secondary | ICD-10-CM | POA: Diagnosis not present

## 2021-10-22 DIAGNOSIS — Z85828 Personal history of other malignant neoplasm of skin: Secondary | ICD-10-CM | POA: Diagnosis not present

## 2021-10-22 DIAGNOSIS — L821 Other seborrheic keratosis: Secondary | ICD-10-CM | POA: Diagnosis not present

## 2021-10-22 DIAGNOSIS — I788 Other diseases of capillaries: Secondary | ICD-10-CM | POA: Diagnosis not present

## 2021-10-22 DIAGNOSIS — L570 Actinic keratosis: Secondary | ICD-10-CM | POA: Diagnosis not present

## 2021-10-23 DIAGNOSIS — F4323 Adjustment disorder with mixed anxiety and depressed mood: Secondary | ICD-10-CM | POA: Diagnosis not present

## 2021-11-06 DIAGNOSIS — F4323 Adjustment disorder with mixed anxiety and depressed mood: Secondary | ICD-10-CM | POA: Diagnosis not present

## 2021-11-27 DIAGNOSIS — F4323 Adjustment disorder with mixed anxiety and depressed mood: Secondary | ICD-10-CM | POA: Diagnosis not present

## 2021-12-09 IMAGING — CT CT CARDIAC CORONARY ARTERY CALCIUM SCORE
3 series · 12 of 20 positions shown, 14 images · non-contrast
Comparison: None.

CLINICAL DATA: 58 -year-old white female

EXAM:
CT CARDIAC CORONARY ARTERY CALCIUM SCORE
TECHNIQUE: Non-contrast imaging through the heart was performed using
prospective ECG gating. Image post processing was performed on an
independent workstation, allowing for quantitative analysis of the
heart and coronary arteries. Note that this exam targets the heart
and the chest was not imaged in its entirety.

[Series 2: calcium scoring 2.00 qr36 bestdiast 70% hrt calciu · axial · 0.24mm/px · z∈[+1644,+1676]mm · 2 of 80 slices shown]
[im 16/80  vessel]
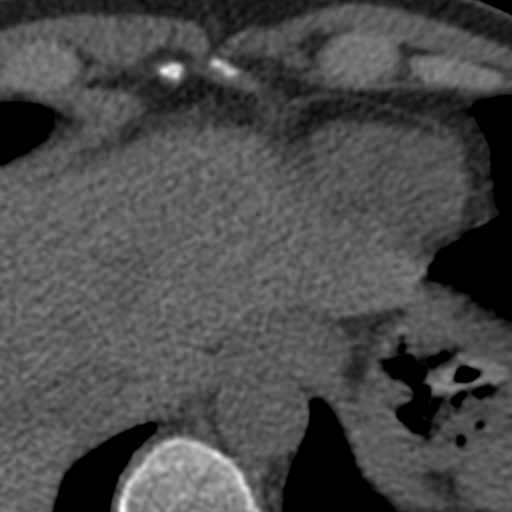
[im 32/80  vessel]
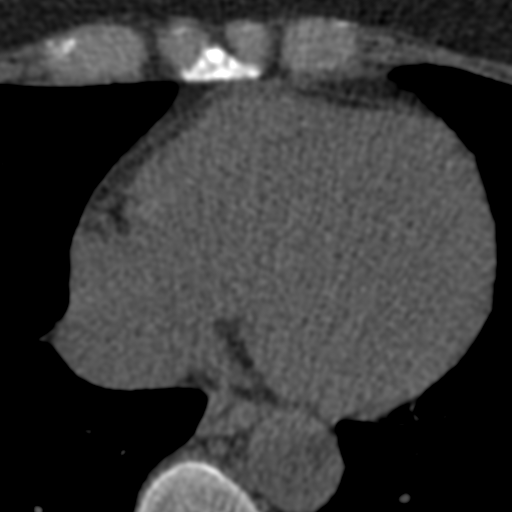

[Series 3: calcium scoring 2.00 br40 bestdiast 70% axial · axial · 0.40mm/px · z∈[+1640,+1744]mm · 5 of 80 slices shown, 7 images]
[im 14/80  vessel]
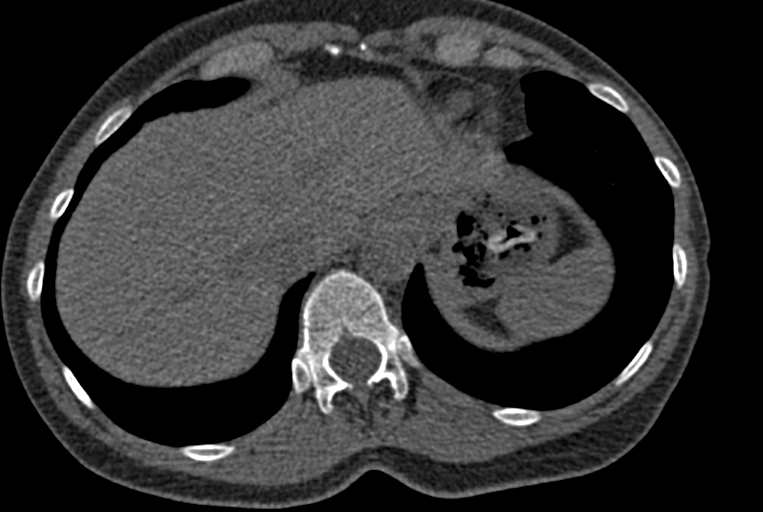
[im 14/80  lung]
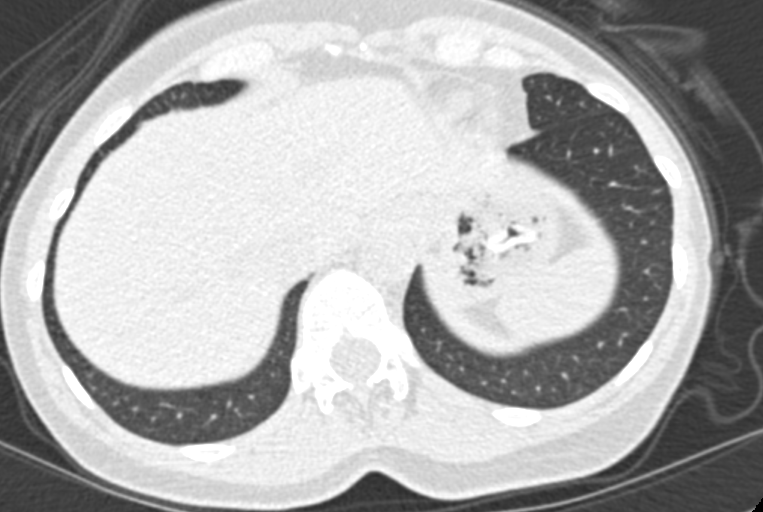
[im 27/80  vessel]
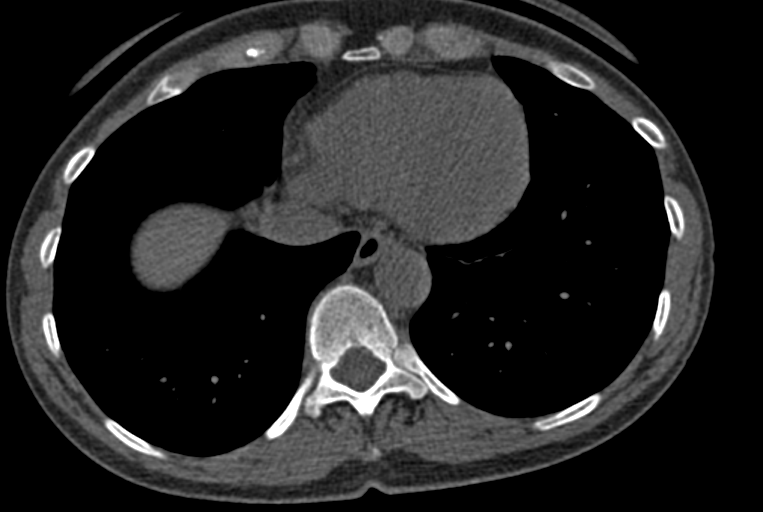
[im 40/80  vessel]
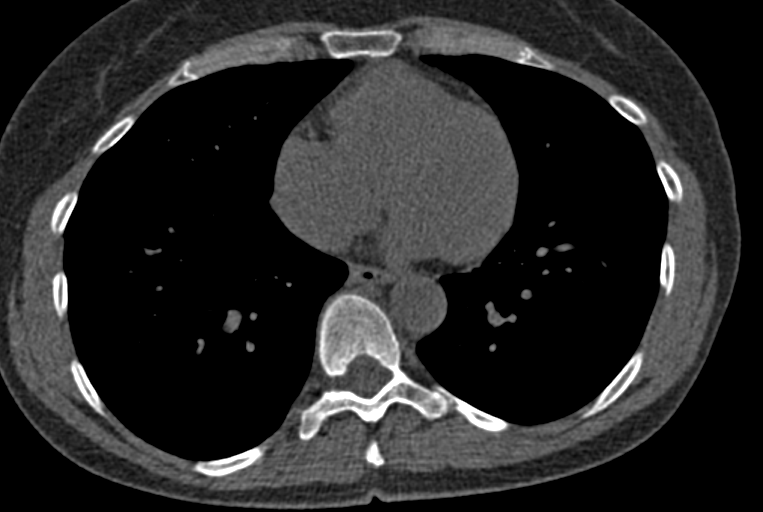
[im 53/80  vessel]
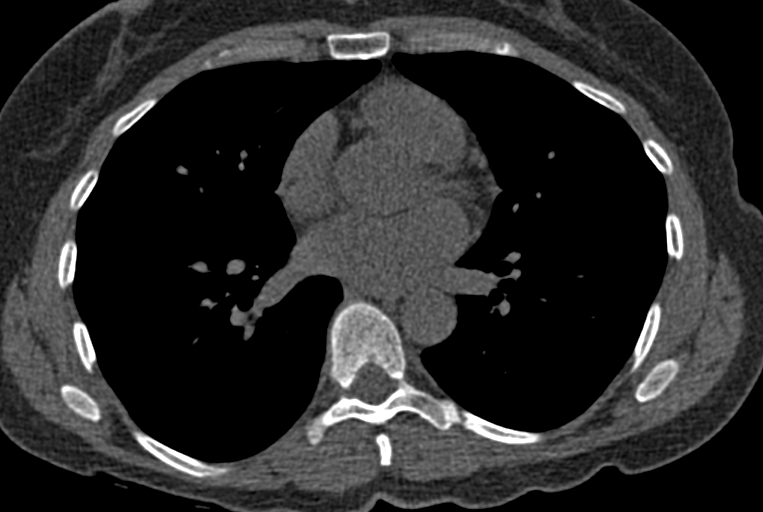
[im 66/80  vessel]
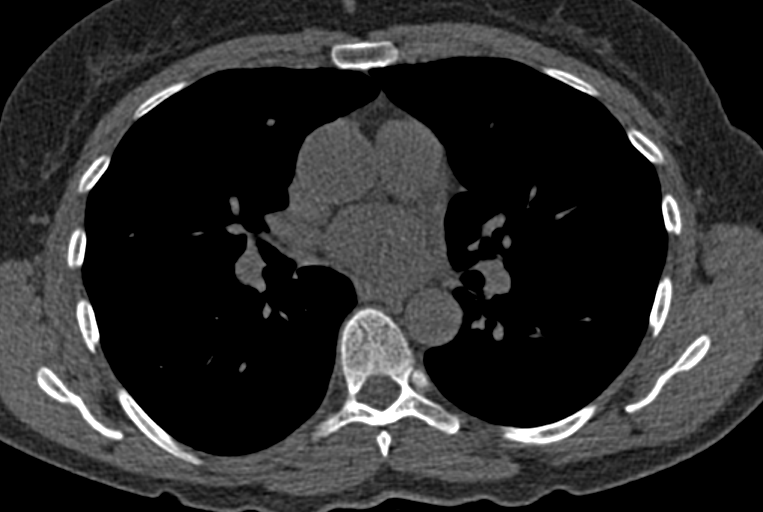
[im 66/80  lung]
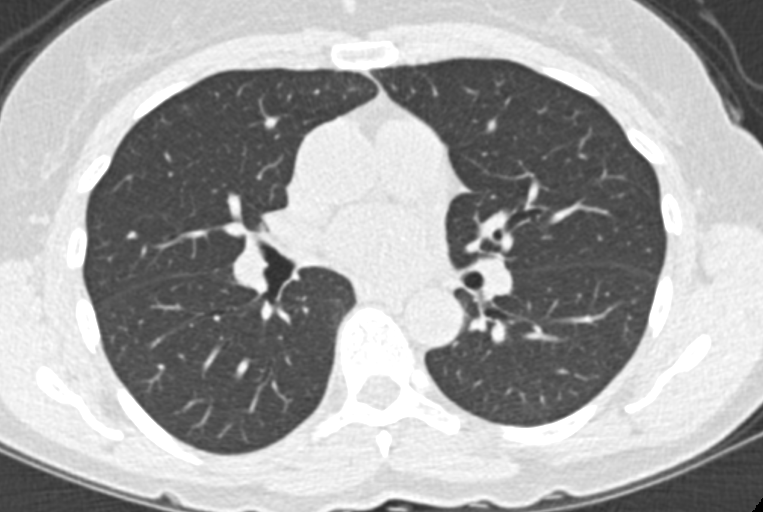

[Series 9: calcium scoring 2.00 br60 bestdiast 70% lungs · axial · 0.40mm/px · z∈[+1640,+1744]mm · 5 of 80 slices shown]
[im 14/80  vessel]
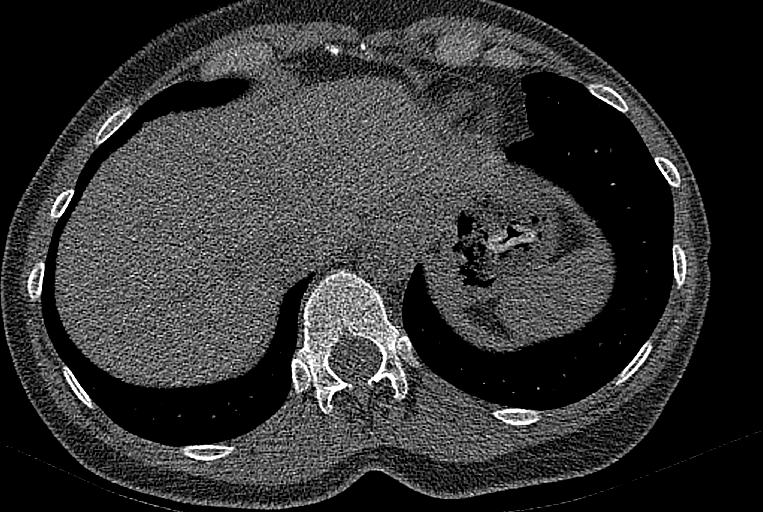
[im 27/80  vessel]
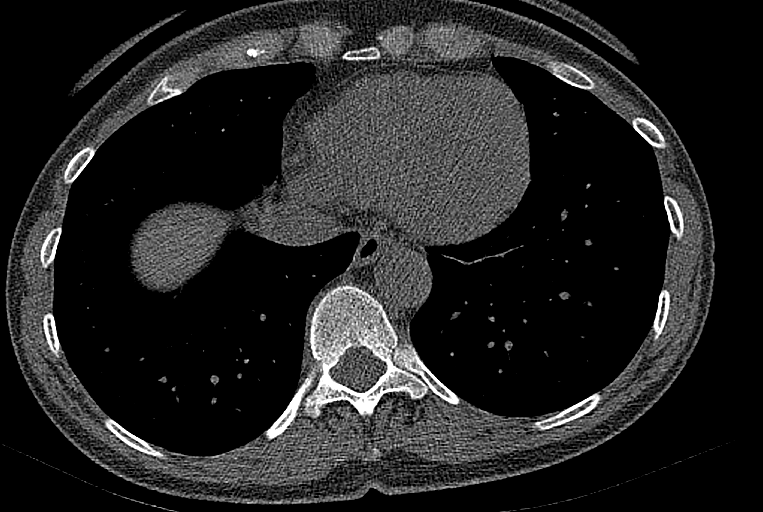
[im 40/80  vessel]
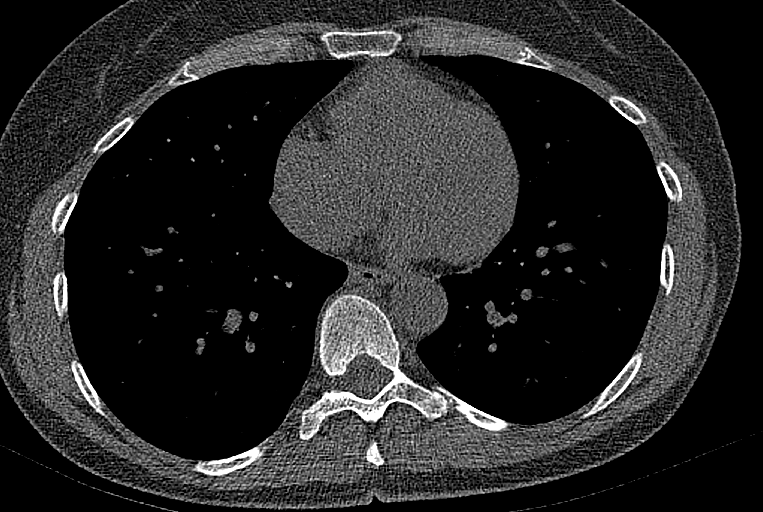
[im 53/80  vessel]
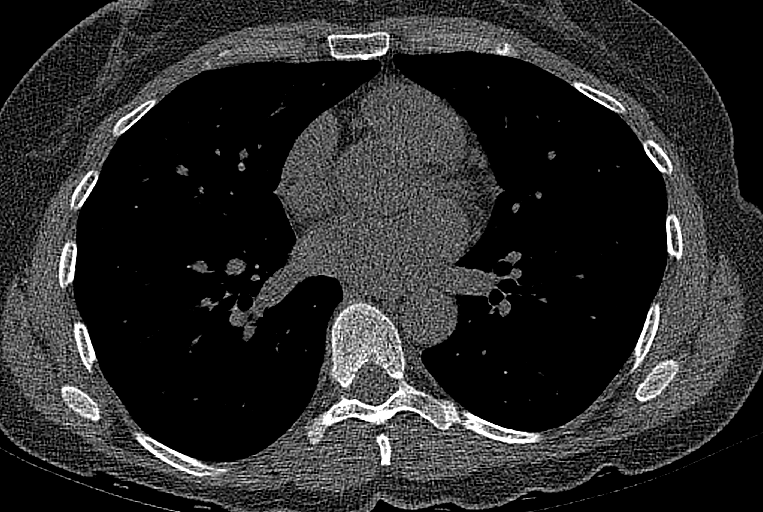
[im 66/80  vessel]
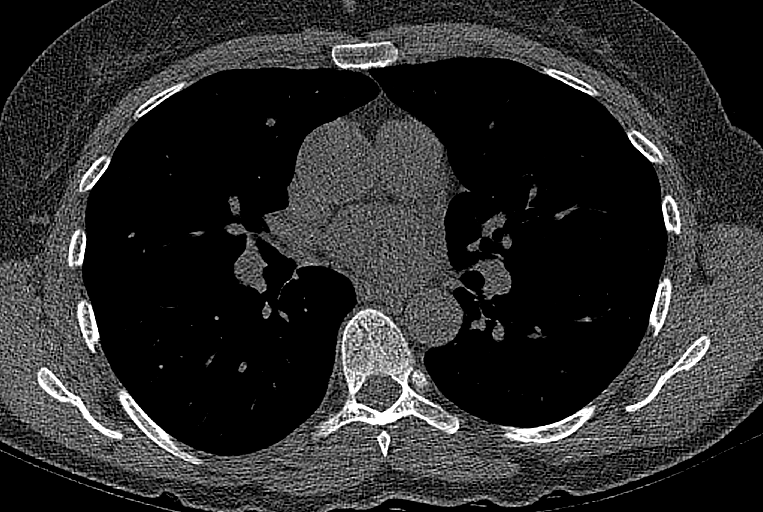

[12 of 20 positions shown; findings below may reference images not displayed]

FINDINGS: Technical quality: Good

CORONARY CALCIUM SCORES:

Left Main: No coronary artery calcification

LAD:

LCx: No coronary calcification

RCA: No coronary calcification

CORONARY CALCIUM

Total Agatston Score:

[HOSPITAL] percentile: 66

Ascending aorta (normal <  40 mm): 29 mm

EXTRACARDIAC FINDINGS:

Limited view of the lung parenchyma demonstrates no suspicious
nodularity. Airways are normal.

Limited view of the mediastinum demonstrates no adenopathy.
Esophagus normal.

Limited view of the upper abdomen unremarkable.

Limited view of the skeleton and chest wall is unremarkable.
IMPRESSION: 1. Single LAD coronary artery calcification.

2. Total Agatston Score:

3. MESA age and sex matched database percentile: 66

## 2021-12-18 DIAGNOSIS — F4323 Adjustment disorder with mixed anxiety and depressed mood: Secondary | ICD-10-CM | POA: Diagnosis not present

## 2022-01-22 DIAGNOSIS — F4323 Adjustment disorder with mixed anxiety and depressed mood: Secondary | ICD-10-CM | POA: Diagnosis not present

## 2022-02-19 DIAGNOSIS — F4323 Adjustment disorder with mixed anxiety and depressed mood: Secondary | ICD-10-CM | POA: Diagnosis not present

## 2022-03-19 DIAGNOSIS — F4323 Adjustment disorder with mixed anxiety and depressed mood: Secondary | ICD-10-CM | POA: Diagnosis not present

## 2022-04-15 DIAGNOSIS — L918 Other hypertrophic disorders of the skin: Secondary | ICD-10-CM | POA: Diagnosis not present

## 2022-04-15 DIAGNOSIS — D2271 Melanocytic nevi of right lower limb, including hip: Secondary | ICD-10-CM | POA: Diagnosis not present

## 2022-04-15 DIAGNOSIS — L570 Actinic keratosis: Secondary | ICD-10-CM | POA: Diagnosis not present

## 2022-04-15 DIAGNOSIS — Z85828 Personal history of other malignant neoplasm of skin: Secondary | ICD-10-CM | POA: Diagnosis not present

## 2022-04-15 DIAGNOSIS — F4323 Adjustment disorder with mixed anxiety and depressed mood: Secondary | ICD-10-CM | POA: Diagnosis not present

## 2022-05-05 DIAGNOSIS — L570 Actinic keratosis: Secondary | ICD-10-CM | POA: Diagnosis not present

## 2022-05-05 DIAGNOSIS — L918 Other hypertrophic disorders of the skin: Secondary | ICD-10-CM | POA: Diagnosis not present

## 2022-05-05 DIAGNOSIS — D0461 Carcinoma in situ of skin of right upper limb, including shoulder: Secondary | ICD-10-CM | POA: Diagnosis not present

## 2022-05-06 DIAGNOSIS — M8580 Other specified disorders of bone density and structure, unspecified site: Secondary | ICD-10-CM | POA: Diagnosis not present

## 2022-05-06 DIAGNOSIS — E785 Hyperlipidemia, unspecified: Secondary | ICD-10-CM | POA: Diagnosis not present

## 2022-05-13 DIAGNOSIS — Z1331 Encounter for screening for depression: Secondary | ICD-10-CM | POA: Diagnosis not present

## 2022-05-13 DIAGNOSIS — Z23 Encounter for immunization: Secondary | ICD-10-CM | POA: Diagnosis not present

## 2022-05-13 DIAGNOSIS — Z0000 Encounter for general adult medical examination without abnormal findings: Secondary | ICD-10-CM | POA: Diagnosis not present

## 2022-05-13 DIAGNOSIS — E785 Hyperlipidemia, unspecified: Secondary | ICD-10-CM | POA: Diagnosis not present

## 2022-05-13 DIAGNOSIS — R82998 Other abnormal findings in urine: Secondary | ICD-10-CM | POA: Diagnosis not present

## 2022-06-11 DIAGNOSIS — Z6822 Body mass index (BMI) 22.0-22.9, adult: Secondary | ICD-10-CM | POA: Diagnosis not present

## 2022-06-11 DIAGNOSIS — Z1231 Encounter for screening mammogram for malignant neoplasm of breast: Secondary | ICD-10-CM | POA: Diagnosis not present

## 2022-06-11 DIAGNOSIS — Z01419 Encounter for gynecological examination (general) (routine) without abnormal findings: Secondary | ICD-10-CM | POA: Diagnosis not present

## 2022-08-12 ENCOUNTER — Other Ambulatory Visit (HOSPITAL_BASED_OUTPATIENT_CLINIC_OR_DEPARTMENT_OTHER): Payer: Self-pay

## 2022-08-12 MED ORDER — COMIRNATY 30 MCG/0.3ML IM SUSY
PREFILLED_SYRINGE | INTRAMUSCULAR | 0 refills | Status: AC
  Filled 2022-08-12: qty 0.3, 1d supply, fill #0

## 2022-08-14 DIAGNOSIS — R0789 Other chest pain: Secondary | ICD-10-CM | POA: Diagnosis not present

## 2022-08-15 ENCOUNTER — Other Ambulatory Visit (HOSPITAL_BASED_OUTPATIENT_CLINIC_OR_DEPARTMENT_OTHER): Payer: Self-pay

## 2022-08-20 ENCOUNTER — Ambulatory Visit (INDEPENDENT_AMBULATORY_CARE_PROVIDER_SITE_OTHER): Payer: BC Managed Care – PPO

## 2022-08-20 ENCOUNTER — Ambulatory Visit: Payer: BC Managed Care – PPO | Attending: Cardiology | Admitting: Cardiology

## 2022-08-20 ENCOUNTER — Encounter: Payer: Self-pay | Admitting: Cardiology

## 2022-08-20 ENCOUNTER — Telehealth (HOSPITAL_COMMUNITY): Payer: Self-pay | Admitting: Cardiology

## 2022-08-20 ENCOUNTER — Other Ambulatory Visit: Payer: Self-pay | Admitting: Internal Medicine

## 2022-08-20 VITALS — BP 122/80 | HR 66 | Ht 62.0 in | Wt 126.4 lb

## 2022-08-20 DIAGNOSIS — R002 Palpitations: Secondary | ICD-10-CM | POA: Diagnosis not present

## 2022-08-20 DIAGNOSIS — E785 Hyperlipidemia, unspecified: Secondary | ICD-10-CM | POA: Diagnosis not present

## 2022-08-20 DIAGNOSIS — R079 Chest pain, unspecified: Secondary | ICD-10-CM | POA: Diagnosis not present

## 2022-08-20 DIAGNOSIS — I493 Ventricular premature depolarization: Secondary | ICD-10-CM

## 2022-08-20 LAB — BASIC METABOLIC PANEL
BUN/Creatinine Ratio: 16 (ref 12–28)
BUN: 14 mg/dL (ref 8–27)
CO2: 27 mmol/L (ref 20–29)
Calcium: 10.5 mg/dL — ABNORMAL HIGH (ref 8.7–10.3)
Chloride: 100 mmol/L (ref 96–106)
Creatinine, Ser: 0.85 mg/dL (ref 0.57–1.00)
Glucose: 91 mg/dL (ref 70–99)
Potassium: 5.2 mmol/L (ref 3.5–5.2)
Sodium: 139 mmol/L (ref 134–144)
eGFR: 77 mL/min/{1.73_m2} (ref >59)

## 2022-08-20 MED ORDER — METOPROLOL TARTRATE 50 MG PO TABS: ORAL_TABLET | ORAL | 0 refills | Status: DC

## 2022-08-20 NOTE — Progress Notes (Unsigned)
Enrolled for [employer] to mail a ZIO XT long term holter monitor to the patients address on file after 09/08/2022.

## 2022-08-20 NOTE — Progress Notes (Signed)
Cardiology Office Note:    Date:  08/20/2022   ID:  Destiny, [name] [birth year], MRN [id number]  PCP:  Shon Baton, MD  Cardiologist:  None  Electrophysiologist:  None   Referring MD: Shon Baton, MD   Chief Complaint  Patient presents with   Chest Pain    History of Present Illness:    Destiny [name] is a 62 y.o. female with a hx of hyperlipidemia who is referred by Dr. Virgina Jock for evaluation of PVCs and chest pain.  PVCs noted on EKG in Dr. Keane Police office.  Calcium score 02/10/2020 was 3 (66 percentile).  She reports she has been having episodes of chest pain when lying down at night, feels like pressure in center chest.  Started several years ago but only had a few episodes last year.  Has now had a few episodes in the last several weeks.  Typically last for 15 to 20 minutes.  She is also having palpitations which feels like her heart is racing and irregular, lasts a couple minutes and occurs a few times per week.  She is very active, exercises daily with walking 5 to 6 miles per day and doing strength training classes.  Reports had 1 episode of chest pain while walking, otherwise denies any exertional chest pain.  Denies any dyspnea or lower extremity edema.  Reports occasional lightheadedness but denies any syncope.  No smoking history.  Family history includes father died of MI at 51, maternal grandfather had CABG in 16s, and brother has A-fib.   Past Medical History:  Diagnosis Date   Environmental allergies    trees and pollen    Past Surgical History:  Procedure Laterality Date   ROTATOR CUFF REPAIR     March 2015 right    Current Medications: Current Meds  Medication Sig   Calcium Citrate 250 MG TABS    cetirizine (ZYRTEC ALLERGY) 10 MG tablet    MAGNESIUM PO Take 1 tablet by mouth daily. Takes with Vitamin D   metoprolol tartrate (LOPRESSOR) 50 MG tablet Take 50 mg (1 tablet) TWO hours prior to CT scan   Omega-3 Fatty Acids (FISH OIL) 1200 MG CPDR Take by mouth.    vitamin B-12 (CYANOCOBALAMIN) 100 MCG tablet Take 100 mcg by mouth daily. 2 tablets daily     Allergies:   Doxycycline and Sulfa antibiotics   Social History   Socioeconomic History   Marital status: Married    Spouse name: Not on file   Number of children: Not on file   Years of education: Not on file   Highest education level: Not on file  Occupational History   Not on file  Tobacco Use   Smoking status: Never   Smokeless tobacco: Never  Vaping Use   Vaping Use: Never used  Substance and Sexual Activity   Alcohol use: Yes    Alcohol/week: 2.0 - 4.0 standard drinks of alcohol    Types: 2 - 4 Glasses of wine per week   Drug use: No   Sexual activity: Not on file  Other Topics Concern   Not on file  Social History Narrative   Not on file   Social Determinants of Health   Financial Resource Strain: Not on file  Food Insecurity: Not on file  Transportation Needs: Not on file  Physical Activity: Not on file  Stress: Not on file  Social Connections: Not on file     Family History: The patient's family history includes Colon polyps in  her brother. There is no history of Colon cancer, Pancreatic cancer, Rectal cancer, or Stomach cancer.  ROS:   Please see the history of present illness.     All other systems reviewed and are negative.  EKGs/Labs/Other Studies Reviewed:    The following studies were reviewed today:   EKG:   08/20/22: Normal sinus rhythm, rate 67, less than 1 mm ST depressions in inferior leads and V3-6, nonspecific T wave flattening  Recent Labs: No results found for requested labs within last 365 days.  Recent Lipid Panel No results found for: "CHOL", "TRIG", "HDL", "CHOLHDL", "VLDL", "LDLCALC", "LDLDIRECT"  Physical Exam:    VS:  BP 122/80   Pulse 66   Ht 5\' 2"  (1.575 m)   Wt 126 lb 6.4 oz (57.3 kg)   SpO2 92%   BMI 23.12 kg/m     Wt Readings from Last 3 Encounters:  08/20/22 126 lb 6.4 oz (57.3 kg)  02/01/18 123 lb (55.8 kg)   05/26/14 122 lb (55.3 kg)     GEN:  Well nourished, well developed in no acute distress HEENT: Normal NECK: No JVD; No carotid bruits LYMPHATICS: No lymphadenopathy CARDIAC: RRR, no murmurs, rubs, gallops RESPIRATORY:  Clear to auscultation without rales, wheezing or rhonchi  ABDOMEN: Soft, non-tender, non-distended MUSCULOSKELETAL:  No edema; No deformity  SKIN: Warm and dry NEUROLOGIC:  Alert and oriented x 3 PSYCHIATRIC:  Normal affect   ASSESSMENT:    1. Chest pain of uncertain etiology   2. Palpitations   3. Hyperlipidemia, unspecified hyperlipidemia type    PLAN:    Chest pain: Atypical in description but does have CAD risk factors (age, family history, hyperlipidemia) and had mild coronary calcifications on prior calcium score in 2021 -Recommend coronary CTA to evaluate for obstructive CAD.  Will give Lopressor 50 mg prior to study -Echocardiogram  Palpitations: Description concerning for arrhythmia.  PVCs noted on EKG at Dr. Keane Police office.  Recommend Zio patch x 7 days.  Hyperlipidemia: LDL 119 on 05/06/2022.  Calcium score 02/10/2020 was 3 (66 percentile).  Discussed starting statin but she wishes to hold off at this time.  Will follow-up results of coronary CTA, but anticipate will start statin based on results  RTC in 4 months   Medication Adjustments/Labs and Tests Ordered: Current medicines are reviewed at length with the patient today.  Concerns regarding medicines are outlined above.  Orders Placed This Encounter  Procedures   CT CORONARY MORPH W/CTA COR W/SCORE W/CA W/CM &/OR WO/CM   Basic metabolic panel   LONG TERM MONITOR (3-14 DAYS)   EKG 12-Lead   ECHOCARDIOGRAM COMPLETE   Meds ordered this encounter  Medications   metoprolol tartrate (LOPRESSOR) 50 MG tablet    Sig: Take 50 mg (1 tablet) TWO hours prior to CT scan    Dispense:  1 tablet    Refill:  0    Patient Instructions  Medication Instructions:  Your physician recommends that you  continue on your current medications as directed. Please refer to the Current Medication list given to you today.  *If you need a refill on your cardiac medications before your next appointment, please call your pharmacy*   Lab Work: BMET today  If you have labs (blood work) drawn today and your tests are completely normal, you will receive your results only by: [place] (if you have MyChart) OR A paper copy in the mail If you have any lab test that is abnormal or we need to change your  treatment, we will call you to review the results.   Testing/Procedures: Coronary CTA-see instructions below  Your physician has requested that you have an echocardiogram. Echocardiography is a painless test that uses sound waves to create images of your heart. It provides your doctor with information about the size and shape of your heart and how well your heart's chambers and valves are working. This procedure takes approximately one hour. There are no restrictions for this procedure. Please do NOT wear cologne, perfume, aftershave, or lotions (deodorant is allowed). Please arrive 15 minutes prior to your appointment time.  ZIO XT- Long Term Monitor Instructions   Your physician has requested you wear a ZIO patch monitor for _7_ days.  This is a single patch monitor.   [employer] supplies one patch monitor per enrollment. Additional stickers are not available. Please do not apply patch if you will be having a Nuclear Stress Test, Echocardiogram, Cardiac CT, MRI, or Chest Xray during the period you would be wearing the monitor. The patch cannot be worn during these tests. You cannot remove and re-apply the ZIO XT patch monitor.  Your ZIO patch monitor will be sent Fed Ex from [employer] directly to your home address. It may take 3-5 days to receive your monitor after you have been enrolled.  Once you have received your monitor, please review the enclosed instructions. Your monitor has already  been registered assigning a specific monitor serial # to you.  Billing and Patient Assistance Program Information   We have supplied [employer] with any of your insurance information on file for billing purposes. [employer] offers a sliding scale Patient Assistance Program for patients that do not have insurance, or whose insurance does not completely cover the cost of the ZIO monitor.   You must apply for the Patient Assistance Program to qualify for this discounted rate.     To apply, please call [employer] at [phone], select option 4, then select option 2, and ask to apply for Patient Assistance Program.  Meredeth Ide will ask your household income, and how many people are in your household.  They will quote your out-of-pocket cost based on that information.  [employer] will also be able to set up a 75-month, interest-free payment plan if needed.  Applying the monitor   Shave hair from upper left chest.  Hold abrader disc by orange tab. Rub abrader in 40 strokes over the upper left chest as indicated in your monitor instructions.  Clean area with 4 enclosed alcohol pads. Let dry.  Apply patch as indicated in monitor instructions. Patch will be placed under collarbone on left side of chest with arrow pointing upward.  Rub patch adhesive wings for 2 minutes. Remove white label marked "1". Remove the white label marked "2". Rub patch adhesive wings for 2 additional minutes.  While looking in a mirror, press and release button in center of patch. A small green light will flash 3-4 times. This will be your only indicator that the monitor has been turned on. ?  Do not shower for the first 24 hours. You may shower after the first 24 hours.  Press the button if you feel a symptom. You will hear a small click. Record Date, Time and Symptom in the Patient Logbook.  When you are ready to remove the patch, follow instructions on the last 2 pages of the Patient Logbook. Stick patch monitor onto the last page of Patient  Logbook.  Place Patient Logbook in the blue and white box.  Use  locking tab on box and tape box closed securely.  The blue and white box has prepaid postage on it. Please place it in the mailbox as soon as possible. Your physician should have your test results approximately 7 days after the monitor has been mailed back to [facility].  Call [place] at [phone] if you have questions regarding your ZIO XT patch monitor. Call them immediately if you see an orange light blinking on your monitor.  If your monitor falls off in less than 4 days, contact our Monitor department at [phone]. ?If your monitor becomes loose or falls off after 4 days call [employer] at [phone] for suggestions on securing your monitor.?  Follow-Up: At [facility], you and your health needs are our priority.  As part of our continuing mission to provide you with exceptional heart care, we have created designated Provider Care Teams.  These Care Teams include your primary Cardiologist (physician) and Advanced Practice Providers (APPs -  Physician Assistants and Nurse Practitioners) who all work together to provide you with the care you need, when you need it.  We recommend signing up for the patient portal called "MyChart".  Sign up information is provided on this After Visit Summary.  MyChart is used to connect with patients for Virtual Visits (Telemedicine).  Patients are able to view lab/test results, encounter notes, upcoming appointments, etc.  Non-urgent messages can be sent to your provider as well.   To learn more about what you can do with MyChart, go to [email or web address].    Your next appointment:   4 month(s)  The format for your next appointment:   In Person  Provider:   Dr. Gardiner Rhyme Other Instructions   Your cardiac CT will be scheduled at one of the below locations:   [facility] [address] [place], [place] [address] [phone]  If  scheduled at [facility], please arrive at the [facility] and Children's Entrance (Entrance C2) of [facility] 30 minutes prior to test start time. You can use the FREE valet parking offered at entrance C (encouraged to control the heart rate for the test)  Proceed to the [facility] Radiology Department (first floor) to check-in and test prep.  All radiology patients and guests should use entrance C2 at [facility], accessed from [facility], even though the hospital's physical address listed is [address]      Please follow these instructions carefully (unless otherwise directed):   On the Night Before the Test: Be sure to Drink plenty of water. Do not consume any caffeinated/decaffeinated beverages or chocolate 12 hours prior to your test. Do not take any antihistamines 12 hours prior to your test.  On the Day of the Test: Drink plenty of water until 1 hour prior to the test. Do not eat any food 1 hour prior to test. You may take your regular medications prior to the test.  Take metoprolol 50 mg (Lopressor) two hours prior to test. FEMALES- please wear underwire-free bra if available, avoid dresses & tight clothing      After the Test: Drink plenty of water. After receiving IV contrast, you may experience a mild flushed feeling. This is normal. On occasion, you may experience a mild rash up to 24 hours after the test. This is not dangerous. If this occurs, you can take Benadryl 25 mg and increase your fluid intake. If you experience trouble breathing, this can be serious. If it is  severe call 911 IMMEDIATELY. If it is mild, please call our office. If you take any of these medications: Glipizide/Metformin, Avandament, Glucavance, please do not take 48 hours after completing test unless otherwise instructed.  We will call to schedule your test 2-4 weeks out understanding that some insurance companies will need an authorization prior to the  service being performed.   For non-scheduling related questions, please contact the cardiac imaging nurse navigator should you have any questions/concerns: Marchia Bond, Cardiac Imaging Nurse Navigator Gordy Clement, Cardiac Imaging Nurse Navigator  Heart and Vascular Services Direct Office Dial: [phone]   For scheduling needs, including cancellations and rescheduling, please call [place], [phone].           Signed, Donato Heinz, MD  08/20/2022 10:16 AM    [place]

## 2022-08-20 NOTE — Telephone Encounter (Signed)
Patient cancelled echocardiogram for the reason below:   08/20/2022 1:41 PM [id number], Destiny [name]  Cancel Rsn: Patient (patient states she was able to get one tomorrow by her PCP)   Order will be removed from the echo Wq.

## 2022-08-20 NOTE — Patient Instructions (Signed)
Medication Instructions:  Your physician recommends that you continue on your current medications as directed. Please refer to the Current Medication list given to you today.  *If you need a refill on your cardiac medications before your next appointment, please call your pharmacy*   Lab Work: BMET today  If you have labs (blood work) drawn today and your tests are completely normal, you will receive your results only by: [place] (if you have MyChart) OR A paper copy in the mail If you have any lab test that is abnormal or we need to change your treatment, we will call you to review the results.   Testing/Procedures: Coronary CTA-see instructions below  Your physician has requested that you have an echocardiogram. Echocardiography is a painless test that uses sound waves to create images of your heart. It provides your doctor with information about the size and shape of your heart and how well your heart's chambers and valves are working. This procedure takes approximately one hour. There are no restrictions for this procedure. Please do NOT wear cologne, perfume, aftershave, or lotions (deodorant is allowed). Please arrive 15 minutes prior to your appointment time.  ZIO XT- Long Term Monitor Instructions   Your physician has requested you wear a ZIO patch monitor for _7_ days.  This is a single patch monitor.   [employer] supplies one patch monitor per enrollment. Additional stickers are not available. Please do not apply patch if you will be having a Nuclear Stress Test, Echocardiogram, Cardiac CT, MRI, or Chest Xray during the period you would be wearing the monitor. The patch cannot be worn during these tests. You cannot remove and re-apply the ZIO XT patch monitor.  Your ZIO patch monitor will be sent Fed Ex from [employer] directly to your home address. It may take 3-5 days to receive your monitor after you have been enrolled.  Once you have received your monitor,  please review the enclosed instructions. Your monitor has already been registered assigning a specific monitor serial # to you.  Billing and Patient Assistance Program Information   We have supplied [employer] with any of your insurance information on file for billing purposes. [employer] offers a sliding scale Patient Assistance Program for patients that do not have insurance, or whose insurance does not completely cover the cost of the ZIO monitor.   You must apply for the Patient Assistance Program to qualify for this discounted rate.     To apply, please call [employer] at [phone], select option 4, then select option 2, and ask to apply for Patient Assistance Program.  Theodore Demark will ask your household income, and how many people are in your household.  They will quote your out-of-pocket cost based on that information.  [employer] will also be able to set up a 59-month, interest-free payment plan if needed.  Applying the monitor   Shave hair from upper left chest.  Hold abrader disc by orange tab. Rub abrader in 40 strokes over the upper left chest as indicated in your monitor instructions.  Clean area with 4 enclosed alcohol pads. Let dry.  Apply patch as indicated in monitor instructions. Patch will be placed under collarbone on left side of chest with arrow pointing upward.  Rub patch adhesive wings for 2 minutes. Remove white label marked "1". Remove the white label marked "2". Rub patch adhesive wings for 2 additional minutes.  While looking in a mirror, press and release button in center of patch. A small green light will flash  3-4 times. This will be your only indicator that the monitor has been turned on. ?  Do not shower for the first 24 hours. You may shower after the first 24 hours.  Press the button if you feel a symptom. You will hear a small click. Record Date, Time and Symptom in the Patient Logbook.  When you are ready to remove the patch, follow instructions on the last 2 pages of the  Patient Logbook. Stick patch monitor onto the last page of Patient Logbook.  Place Patient Logbook in the blue and white box.  Use locking tab on box and tape box closed securely.  The blue and white box has prepaid postage on it. Please place it in the mailbox as soon as possible. Your physician should have your test results approximately 7 days after the monitor has been mailed back to [facility].  Call [facility] Customer Care at [phone] if you have questions regarding your ZIO XT patch monitor. Call them immediately if you see an orange light blinking on your monitor.  If your monitor falls off in less than 4 days, contact our Monitor department at [phone]. ?If your monitor becomes loose or falls off after 4 days call [employer] at [phone] for suggestions on securing your monitor.?  Follow-Up: At [facility], you and your health needs are our priority.  As part of our continuing mission to provide you with exceptional heart care, we have created designated Provider Care Teams.  These Care Teams include your primary Cardiologist (physician) and Advanced Practice Providers (APPs -  Physician Assistants and Nurse Practitioners) who all work together to provide you with the care you need, when you need it.  We recommend signing up for the patient portal called "MyChart".  Sign up information is provided on this After Visit Summary.  MyChart is used to connect with patients for Virtual Visits (Telemedicine).  Patients are able to view lab/test results, encounter notes, upcoming appointments, etc.  Non-urgent messages can be sent to your provider as well.   To learn more about what you can do with MyChart, go to [email or web address].    Your next appointment:   4 month(s)  The format for your next appointment:   In Person  Provider:   Dr. Bjorn Pippin Other Instructions   Your cardiac CT will be scheduled at one of the below locations:   [facility] [address] [place], [place] [address] [phone]  If scheduled at [facility], please arrive at the [facility] and Children's Entrance (Entrance C2) of [facility] 30 minutes prior to test start time. You can use the FREE valet parking offered at entrance C (encouraged to control the heart rate for the test)  Proceed to the [facility] Radiology Department (first floor) to check-in and test prep.  All radiology patients and guests should use entrance C2 at [facility], accessed from [facility], even though the hospital's physical address listed is [address]      Please follow these instructions carefully (unless otherwise directed):   On the Night Before the Test: Be sure to Drink plenty of water. Do not consume any caffeinated/decaffeinated beverages or chocolate 12 hours prior to your test. Do not take any antihistamines 12 hours prior to your test.  On the Day of the Test: Drink plenty of water until 1 hour prior to the test. Do not eat any food 1 hour prior to test. You may take your  regular medications prior to the test.  Take metoprolol 50 mg (Lopressor) two hours prior to test. FEMALES- please wear underwire-free bra if available, avoid dresses & tight clothing      After the Test: Drink plenty of water. After receiving IV contrast, you may experience a mild flushed feeling. This is normal. On occasion, you may experience a mild rash up to 24 hours after the test. This is not dangerous. If this occurs, you can take Benadryl 25 mg and increase your fluid intake. If you experience trouble breathing, this can be serious. If it is severe call 911 IMMEDIATELY. If it is mild, please call our office. If you take any of these medications: Glipizide/Metformin, Avandament, Glucavance, please do not take 48 hours after completing test unless otherwise instructed.  We will call to schedule your test 2-4 weeks out understanding that  some insurance companies will need an authorization prior to the service being performed.   For non-scheduling related questions, please contact the cardiac imaging nurse navigator should you have any questions/concerns: Marchia Bond, Cardiac Imaging Nurse Navigator Gordy Clement, Cardiac Imaging Nurse Navigator [place] [facility] Direct Office Dial: [phone]   For scheduling needs, including cancellations and rescheduling, please call [place], [phone].

## 2022-08-21 ENCOUNTER — Ambulatory Visit: Payer: BC Managed Care – PPO

## 2022-08-21 DIAGNOSIS — I493 Ventricular premature depolarization: Secondary | ICD-10-CM

## 2022-08-21 DIAGNOSIS — I340 Nonrheumatic mitral (valve) insufficiency: Secondary | ICD-10-CM | POA: Diagnosis not present

## 2022-08-22 ENCOUNTER — Telehealth (HOSPITAL_COMMUNITY): Payer: Self-pay | Admitting: Emergency Medicine

## 2022-08-22 NOTE — Telephone Encounter (Signed)
Attempted to call patient regarding upcoming cardiac CT appointment. Left message on voicemail with name and callback number Marchia Bond RN Navigator Cardiac Section Heart and Vascular Services [phone] Office [phone] Cell

## 2022-08-22 NOTE — Telephone Encounter (Signed)
Reaching out to patient to offer assistance regarding upcoming cardiac imaging study; pt verbalizes understanding of appt date/time, parking situation and where to check in, pre-test NPO status and medications ordered, and verified current allergies; name and call back number provided for further questions should they arise Marchia Bond RN Navigator Cardiac Imaging [facility] [phone] office [phone] cell   Arrival [address] entrance  Denies iv issues Aware contrast/nitro 50mg  metoprolol tartrate

## 2022-08-25 ENCOUNTER — Ambulatory Visit (HOSPITAL_COMMUNITY)
Admission: RE | Admit: 2022-08-25 | Discharge: 2022-08-25 | Disposition: A | Discharge status: Home / Self Care | Payer: BC Managed Care – PPO | Source: Ambulatory Visit | Attending: Cardiology | Admitting: Cardiology

## 2022-08-25 DIAGNOSIS — R079 Chest pain, unspecified: Secondary | ICD-10-CM | POA: Insufficient documentation

## 2022-08-25 MED ORDER — NITROGLYCERIN 0.4 MG SL SUBL
0.8000 mg | SUBLINGUAL_TABLET | Freq: Once | SUBLINGUAL | Status: AC
  Administered 2022-08-25: 0.8 mg via SUBLINGUAL

## 2022-08-25 MED ORDER — NITROGLYCERIN 0.4 MG SL SUBL
SUBLINGUAL_TABLET | SUBLINGUAL | Status: AC
  Filled 2022-08-25: qty 2

## 2022-08-25 MED ORDER — IOHEXOL 350 MG/ML SOLN
95.0000 mL | Freq: Once | INTRAVENOUS | Status: AC | PRN
  Administered 2022-08-25: 95 mL via INTRAVENOUS

## 2022-08-26 ENCOUNTER — Encounter: Payer: Self-pay | Admitting: Cardiology

## 2022-08-26 ENCOUNTER — Other Ambulatory Visit: Payer: Self-pay | Admitting: *Deleted

## 2022-08-26 DIAGNOSIS — I2510 Atherosclerotic heart disease of native coronary artery without angina pectoris: Secondary | ICD-10-CM

## 2022-08-26 DIAGNOSIS — E785 Hyperlipidemia, unspecified: Secondary | ICD-10-CM

## 2022-08-26 MED ORDER — ROSUVASTATIN CALCIUM 10 MG PO TABS: 10.0000 mg | ORAL_TABLET | Freq: Every day | ORAL | 3 refills | Status: DC

## 2022-08-27 DIAGNOSIS — E785 Hyperlipidemia, unspecified: Secondary | ICD-10-CM | POA: Diagnosis not present

## 2022-09-08 ENCOUNTER — Telehealth: Payer: Self-pay | Admitting: Cardiology

## 2022-09-08 NOTE — Telephone Encounter (Signed)
  Pt is calling to follow up her heart monitor. It was ordered by Dr. Gardiner Rhyme

## 2022-09-08 NOTE — Telephone Encounter (Signed)
[employer] had been notified to ship monitor after 09/08/22.  We could not specify a delivery date of 09/08/22.  Patient stated she informed her physician, she would return from her trrip 09/07/22.  I called Linton Ham from [place] [employer] and asked him if we could get monitor sent out today instead of tomorrow.  Monitors are usually mailed within 2 day delivery.

## 2022-09-12 ENCOUNTER — Other Ambulatory Visit (HOSPITAL_COMMUNITY): Payer: No Typology Code available for payment source

## 2022-09-12 DIAGNOSIS — R002 Palpitations: Secondary | ICD-10-CM | POA: Diagnosis not present

## 2022-09-15 ENCOUNTER — Other Ambulatory Visit (HOSPITAL_COMMUNITY): Payer: No Typology Code available for payment source

## 2022-09-16 DIAGNOSIS — L820 Inflamed seborrheic keratosis: Secondary | ICD-10-CM | POA: Diagnosis not present

## 2022-09-16 DIAGNOSIS — L570 Actinic keratosis: Secondary | ICD-10-CM | POA: Diagnosis not present

## 2022-09-16 DIAGNOSIS — D0439 Carcinoma in situ of skin of other parts of face: Secondary | ICD-10-CM | POA: Diagnosis not present

## 2022-09-25 ENCOUNTER — Encounter: Payer: Self-pay | Admitting: Cardiology

## 2022-09-25 DIAGNOSIS — M8589 Other specified disorders of bone density and structure, multiple sites: Secondary | ICD-10-CM | POA: Diagnosis not present

## 2022-09-28 NOTE — Telephone Encounter (Signed)
See result note.  

## 2022-09-29 ENCOUNTER — Other Ambulatory Visit: Payer: Self-pay | Admitting: *Deleted

## 2022-09-29 ENCOUNTER — Encounter: Payer: Self-pay | Admitting: Cardiology

## 2022-09-29 MED ORDER — METOPROLOL SUCCINATE ER 25 MG PO TB24: 25.0000 mg | ORAL_TABLET | Freq: Every day | ORAL | 3 refills | Status: DC

## 2022-09-29 NOTE — Telephone Encounter (Signed)
Called patient to discuss monitor results.  Patient states she usually has symptoms during the night but not frequently.   She reports symptoms can occur once a month, sometimes every other week, sometimes every 3 months.  Patient would prefer to hold off on medication at this time if there is no danger/harm in the frequency of her PACs/SVT.     Advised would send to MD to review.

## 2022-09-30 NOTE — Telephone Encounter (Signed)
Yes that is fine to hold off on medication.  The episodes of SVT/PACs are not dangerous, treatment is driven by symptoms.  If symptoms are not bothersome at this time we can hold off on medication.

## 2022-10-02 NOTE — Telephone Encounter (Signed)
Spoke to patient, patient aware and would like to hold off on medication at this time.  Med list updated

## 2022-10-14 DIAGNOSIS — Z85828 Personal history of other malignant neoplasm of skin: Secondary | ICD-10-CM | POA: Diagnosis not present

## 2022-10-14 DIAGNOSIS — D0439 Carcinoma in situ of skin of other parts of face: Secondary | ICD-10-CM | POA: Diagnosis not present

## 2022-10-29 DIAGNOSIS — M7702 Medial epicondylitis, left elbow: Secondary | ICD-10-CM | POA: Diagnosis not present

## 2022-10-29 DIAGNOSIS — M25522 Pain in left elbow: Secondary | ICD-10-CM | POA: Insufficient documentation

## 2022-10-30 DIAGNOSIS — I2510 Atherosclerotic heart disease of native coronary artery without angina pectoris: Secondary | ICD-10-CM | POA: Diagnosis not present

## 2022-10-30 DIAGNOSIS — E785 Hyperlipidemia, unspecified: Secondary | ICD-10-CM | POA: Diagnosis not present

## 2022-10-31 LAB — LIPID PANEL
Chol/HDL Ratio: 2.6 ratio (ref 0.0–4.4)
Cholesterol, Total: 181 mg/dL (ref 100–199)
HDL: 70 mg/dL (ref >39)
LDL Chol Calc (NIH): 101 mg/dL — ABNORMAL HIGH (ref 0–99)
Triglycerides: 51 mg/dL (ref 0–149)
VLDL Cholesterol Cal: 10 mg/dL (ref 5–40)

## 2022-11-03 ENCOUNTER — Other Ambulatory Visit: Payer: Self-pay | Admitting: *Deleted

## 2022-11-03 DIAGNOSIS — F4323 Adjustment disorder with mixed anxiety and depressed mood: Secondary | ICD-10-CM | POA: Diagnosis not present

## 2022-11-03 MED ORDER — ROSUVASTATIN CALCIUM 20 MG PO TABS: 20.0000 mg | ORAL_TABLET | Freq: Every day | ORAL | 3 refills | Status: DC

## 2022-11-06 DIAGNOSIS — F4323 Adjustment disorder with mixed anxiety and depressed mood: Secondary | ICD-10-CM | POA: Diagnosis not present

## 2022-12-10 DIAGNOSIS — M9902 Segmental and somatic dysfunction of thoracic region: Secondary | ICD-10-CM | POA: Diagnosis not present

## 2022-12-10 DIAGNOSIS — M9901 Segmental and somatic dysfunction of cervical region: Secondary | ICD-10-CM | POA: Diagnosis not present

## 2022-12-10 DIAGNOSIS — M7712 Lateral epicondylitis, left elbow: Secondary | ICD-10-CM | POA: Diagnosis not present

## 2022-12-10 DIAGNOSIS — M7542 Impingement syndrome of left shoulder: Secondary | ICD-10-CM | POA: Diagnosis not present

## 2022-12-12 DIAGNOSIS — M9907 Segmental and somatic dysfunction of upper extremity: Secondary | ICD-10-CM | POA: Diagnosis not present

## 2022-12-12 DIAGNOSIS — M9901 Segmental and somatic dysfunction of cervical region: Secondary | ICD-10-CM | POA: Diagnosis not present

## 2022-12-12 DIAGNOSIS — M7542 Impingement syndrome of left shoulder: Secondary | ICD-10-CM | POA: Diagnosis not present

## 2022-12-12 DIAGNOSIS — M9902 Segmental and somatic dysfunction of thoracic region: Secondary | ICD-10-CM | POA: Diagnosis not present

## 2022-12-15 DIAGNOSIS — L821 Other seborrheic keratosis: Secondary | ICD-10-CM | POA: Diagnosis not present

## 2022-12-15 DIAGNOSIS — M7542 Impingement syndrome of left shoulder: Secondary | ICD-10-CM | POA: Diagnosis not present

## 2022-12-15 DIAGNOSIS — M9907 Segmental and somatic dysfunction of upper extremity: Secondary | ICD-10-CM | POA: Diagnosis not present

## 2022-12-15 DIAGNOSIS — L570 Actinic keratosis: Secondary | ICD-10-CM | POA: Diagnosis not present

## 2022-12-15 DIAGNOSIS — M9902 Segmental and somatic dysfunction of thoracic region: Secondary | ICD-10-CM | POA: Diagnosis not present

## 2022-12-15 DIAGNOSIS — Z85828 Personal history of other malignant neoplasm of skin: Secondary | ICD-10-CM | POA: Diagnosis not present

## 2022-12-15 DIAGNOSIS — M9901 Segmental and somatic dysfunction of cervical region: Secondary | ICD-10-CM | POA: Diagnosis not present

## 2022-12-15 NOTE — Progress Notes (Signed)
Cardiology Office Note:    Date:  12/19/2022   ID:  Destiny, [name] 1960-12-14, MRN [id number]  PCP:  Creola Corn, MD  Cardiologist:  None  Electrophysiologist:  None   Referring MD: Creola Corn, MD   Chief Complaint  Patient presents with   Follow-up    4 months.   Chest Pain    History of Present Illness:    Destiny [name] is a 62 y.o. female with a hx of hyperlipidemia who presents for follow-up.  She was referred by Dr. Timothy Lasso for evaluation of PVCs and chest pain, initially seen 08/20/2022.  PVCs noted on EKG in Dr. Ferd Hibbs office.  Calcium score 02/10/2020 was 3 (66 percentile).  She reports she has been having episodes of chest pain when lying down at night, feels like pressure in center chest.  Started several years ago but only had a few episodes last year.  Has now had a few episodes in the last several weeks.  Typically last for 15 to 20 minutes.  She is also having palpitations which feels like her heart is racing and irregular, lasts a couple minutes and occurs a few times per week.  She is very active, exercises daily with walking 5 to 6 miles per day and doing strength training classes.  Reports had 1 episode of chest pain while walking, otherwise denies any exertional chest pain.  Denies any dyspnea or lower extremity edema.  Reports occasional lightheadedness but denies any syncope.  No smoking history.  Family history includes father died of MI at 71, maternal grandfather had CABG in 38s, and brother has A-fib.  Echocardiogram 08/21/2022 [facility]) showed normal LV systolic function, mild mitral regurgitation.  Coronary CTA 08/25/2022 showed minimal CAD, calcium score 1 (57th percentile).  Zio patch x 7 days 09/2022 showed 14 episodes of SVT (longest lasting 16 beats) and occasional PACs (4% of beats).  Since last clinic visit, she reports that she is doing well.  States that chest pain has improved, as well as palpitations.  She denies any dyspnea, lower extremity edema, or  palpitations.  Reports some lightheadedness with denies any syncope.  Exercises 6 to 7 days/week, denies any exertional chest pain or dyspnea.   Past Medical History:  Diagnosis Date   Environmental allergies    trees and pollen    Past Surgical History:  Procedure Laterality Date   ROTATOR CUFF REPAIR     March 2015 right    Current Medications: Current Meds  Medication Sig   Calcium Citrate 250 MG TABS    calcium-vitamin D (OSCAL WITH D) 500-5 MG-MCG tablet Take 1 tablet by mouth.   cetirizine (ZYRTEC ALLERGY) 10 MG tablet    COVID-19 mRNA vaccine 2023-2024 (COMIRNATY) syringe Inject into the muscle.   MAGNESIUM PO Take 1 tablet by mouth daily. Takes with Vitamin D   Omega-3 Fatty Acids (FISH OIL) 1200 MG CPDR Take by mouth.   rosuvastatin (CRESTOR) 20 MG tablet Take 1 tablet (20 mg total) by mouth daily.   vitamin B-12 (CYANOCOBALAMIN) 100 MCG tablet Take 100 mcg by mouth daily. 2 tablets daily     Allergies:   Doxycycline and Sulfa antibiotics   Social History   Socioeconomic History   Marital status: Married    Spouse name: Not on file   Number of children: Not on file   Years of education: Not on file   Highest education level: Not on file  Occupational History   Not on file  Tobacco  Use   Smoking status: Never   Smokeless tobacco: Never  Vaping Use   Vaping Use: Never used  Substance and Sexual Activity   Alcohol use: Yes    Alcohol/week: 2.0 - 4.0 standard drinks of alcohol    Types: 2 - 4 Glasses of wine per week   Drug use: No   Sexual activity: Not on file  Other Topics Concern   Not on file  Social History Narrative   Not on file   Social Determinants of Health   Financial Resource Strain: Not on file  Food Insecurity: Not on file  Transportation Needs: Not on file  Physical Activity: Not on file  Stress: Not on file  Social Connections: Not on file     Family History: The patient's family history includes Colon polyps in her brother.  There is no history of Colon cancer, Pancreatic cancer, Rectal cancer, or Stomach cancer.  ROS:   Please see the history of present illness.     All other systems reviewed and are negative.  EKGs/Labs/Other Studies Reviewed:    The following studies were reviewed today:   EKG:   08/20/22: Normal sinus rhythm, rate 67, less than 1 mm ST depressions in inferior leads and V3-6, nonspecific T wave flattening  Recent Labs: 08/20/2022: BUN 14; Creatinine, Ser 0.85; Potassium 5.2; Sodium 139  Recent Lipid Panel    Component Value Date/Time   CHOL 181 10/30/2022 0816   TRIG 51 10/30/2022 0816   HDL 70 10/30/2022 0816   CHOLHDL 2.6 10/30/2022 0816   LDLCALC 101 (H) 10/30/2022 0816    Physical Exam:    VS:  BP 106/66 (BP Location: Left Arm, Patient Position: Sitting, Cuff Size: Normal)   Pulse 72   Ht 5\' 2"  (1.575 m)   Wt 124 lb (56.2 kg)   BMI 22.68 kg/m     Wt Readings from Last 3 Encounters:  12/19/22 124 lb (56.2 kg)  08/20/22 126 lb 6.4 oz (57.3 kg)  02/01/18 123 lb (55.8 kg)     GEN:  Well nourished, well developed in no acute distress HEENT: Normal NECK: No JVD; No carotid bruits LYMPHATICS: No lymphadenopathy CARDIAC: RRR, no murmurs, rubs, gallops RESPIRATORY:  Clear to auscultation without rales, wheezing or rhonchi  ABDOMEN: Soft, non-tender, non-distended MUSCULOSKELETAL:  No edema; No deformity  SKIN: Warm and dry NEUROLOGIC:  Alert and oriented x 3 PSYCHIATRIC:  Normal affect   ASSESSMENT:    1. CAD in native artery   2. Hyperlipidemia, unspecified hyperlipidemia type   3. Palpitations     PLAN:    CAD: Reported atypical chest pain.  Echocardiogram 08/21/2022 [facility]) showed normal LV systolic function, mild mitral regurgitation.  Coronary CTA 08/25/2022 showed minimal CAD, calcium score 1 (57th percentile).   -Continue rosuvastatin  Palpitations:  Zio patch x 7 days 09/2022 showed 14 episodes of SVT (longest lasting 16 beats) and occasional PACs (4%  of beats).   -She reports palpitations have improved, will hold off on treatment at this time  Hyperlipidemia: LDL 119 on 05/06/2022.  Coronary CTA results as above.  Started rosuvastatin 10 mg daily, LDL 101 on 10/30/2022, rosuvastatin increased to 20 mg daily.  Check fasting lipid panel next month, goal LDL less than 70 given CAD as above  RTC in 1 year   Medication Adjustments/Labs and Tests Ordered: Current medicines are reviewed at length with the patient today.  Concerns regarding medicines are outlined above.  No orders of the defined types were placed  in this encounter.  No orders of the defined types were placed in this encounter.   There are no Patient Instructions on file for this visit.   Signed, Little Ishikawa, MD  12/19/2022 9:17 AM    [facility]

## 2022-12-17 DIAGNOSIS — M9907 Segmental and somatic dysfunction of upper extremity: Secondary | ICD-10-CM | POA: Diagnosis not present

## 2022-12-17 DIAGNOSIS — M9901 Segmental and somatic dysfunction of cervical region: Secondary | ICD-10-CM | POA: Diagnosis not present

## 2022-12-17 DIAGNOSIS — M7542 Impingement syndrome of left shoulder: Secondary | ICD-10-CM | POA: Diagnosis not present

## 2022-12-17 DIAGNOSIS — M9902 Segmental and somatic dysfunction of thoracic region: Secondary | ICD-10-CM | POA: Diagnosis not present

## 2022-12-19 ENCOUNTER — Ambulatory Visit: Payer: BC Managed Care – PPO | Attending: Cardiology | Admitting: Cardiology

## 2022-12-19 ENCOUNTER — Encounter: Payer: Self-pay | Admitting: Cardiology

## 2022-12-19 VITALS — BP 106/66 | HR 72 | Ht 62.0 in | Wt 124.0 lb

## 2022-12-19 DIAGNOSIS — E785 Hyperlipidemia, unspecified: Secondary | ICD-10-CM

## 2022-12-19 DIAGNOSIS — R002 Palpitations: Secondary | ICD-10-CM

## 2022-12-19 DIAGNOSIS — I2510 Atherosclerotic heart disease of native coronary artery without angina pectoris: Secondary | ICD-10-CM

## 2022-12-19 NOTE — Patient Instructions (Signed)
Medication Instructions:  Your physician recommends that you continue on your current medications as directed. Please refer to the Current Medication list given to you today.  *If you need a refill on your cardiac medications before your next appointment, please call your pharmacy*  Lab Work: Please return for FASTING labs in 1 month (Lipid)  Our in office lab hours are Monday-Friday 8:00-4:00, closed for lunch 1-2 pm.  No appointment needed.  [facility] locations:   [facility] - [address]  - [address] ([facility]) - [phone] [address]   [place] - [address]    [place]  - [address]    [place] - [address] - [address] [employer]  - [address] [place] - [address] ([facility])  Follow-Up: At [facility], you and your health needs are our priority.  As part of our continuing mission to provide you with exceptional heart care, we have created designated Provider Care Teams.  These Care Teams include your primary Cardiologist (physician) and Advanced Practice Providers (APPs -  Physician Assistants and Nurse Practitioners) who all work together to provide you with the care you need, when you need it.  We recommend signing up for the patient portal called "MyChart".  Sign up information is provided on this After Visit Summary.  MyChart is used to connect with patients for Virtual Visits (Telemedicine).  Patients are able to view lab/test results, encounter notes, upcoming appointments, etc.  Non-urgent messages can be sent to your provider as well.   To learn more about what you can do with MyChart, go to [email or web address].    Your next appointment:   12 month(s)  Provider:   Dr. Bjorn Pippin

## 2022-12-22 DIAGNOSIS — M9901 Segmental and somatic dysfunction of cervical region: Secondary | ICD-10-CM | POA: Diagnosis not present

## 2022-12-22 DIAGNOSIS — M9907 Segmental and somatic dysfunction of upper extremity: Secondary | ICD-10-CM | POA: Diagnosis not present

## 2022-12-22 DIAGNOSIS — M7542 Impingement syndrome of left shoulder: Secondary | ICD-10-CM | POA: Diagnosis not present

## 2022-12-22 DIAGNOSIS — M9902 Segmental and somatic dysfunction of thoracic region: Secondary | ICD-10-CM | POA: Diagnosis not present

## 2022-12-26 DIAGNOSIS — M9902 Segmental and somatic dysfunction of thoracic region: Secondary | ICD-10-CM | POA: Diagnosis not present

## 2022-12-26 DIAGNOSIS — M9907 Segmental and somatic dysfunction of upper extremity: Secondary | ICD-10-CM | POA: Diagnosis not present

## 2022-12-26 DIAGNOSIS — M7542 Impingement syndrome of left shoulder: Secondary | ICD-10-CM | POA: Diagnosis not present

## 2022-12-26 DIAGNOSIS — M9901 Segmental and somatic dysfunction of cervical region: Secondary | ICD-10-CM | POA: Diagnosis not present

## 2022-12-29 DIAGNOSIS — M7542 Impingement syndrome of left shoulder: Secondary | ICD-10-CM | POA: Diagnosis not present

## 2022-12-29 DIAGNOSIS — M9907 Segmental and somatic dysfunction of upper extremity: Secondary | ICD-10-CM | POA: Diagnosis not present

## 2022-12-29 DIAGNOSIS — M9902 Segmental and somatic dysfunction of thoracic region: Secondary | ICD-10-CM | POA: Diagnosis not present

## 2022-12-29 DIAGNOSIS — M9901 Segmental and somatic dysfunction of cervical region: Secondary | ICD-10-CM | POA: Diagnosis not present

## 2022-12-31 DIAGNOSIS — M9902 Segmental and somatic dysfunction of thoracic region: Secondary | ICD-10-CM | POA: Diagnosis not present

## 2022-12-31 DIAGNOSIS — M9907 Segmental and somatic dysfunction of upper extremity: Secondary | ICD-10-CM | POA: Diagnosis not present

## 2022-12-31 DIAGNOSIS — M9901 Segmental and somatic dysfunction of cervical region: Secondary | ICD-10-CM | POA: Diagnosis not present

## 2022-12-31 DIAGNOSIS — M7542 Impingement syndrome of left shoulder: Secondary | ICD-10-CM | POA: Diagnosis not present

## 2023-01-02 DIAGNOSIS — J069 Acute upper respiratory infection, unspecified: Secondary | ICD-10-CM | POA: Diagnosis not present

## 2023-01-02 DIAGNOSIS — J40 Bronchitis, not specified as acute or chronic: Secondary | ICD-10-CM | POA: Diagnosis not present

## 2023-01-02 DIAGNOSIS — R051 Acute cough: Secondary | ICD-10-CM | POA: Diagnosis not present

## 2023-01-06 DIAGNOSIS — M9902 Segmental and somatic dysfunction of thoracic region: Secondary | ICD-10-CM | POA: Diagnosis not present

## 2023-01-06 DIAGNOSIS — M7542 Impingement syndrome of left shoulder: Secondary | ICD-10-CM | POA: Diagnosis not present

## 2023-01-06 DIAGNOSIS — M9907 Segmental and somatic dysfunction of upper extremity: Secondary | ICD-10-CM | POA: Diagnosis not present

## 2023-01-06 DIAGNOSIS — M9901 Segmental and somatic dysfunction of cervical region: Secondary | ICD-10-CM | POA: Diagnosis not present

## 2023-01-15 DIAGNOSIS — M9901 Segmental and somatic dysfunction of cervical region: Secondary | ICD-10-CM | POA: Diagnosis not present

## 2023-01-15 DIAGNOSIS — M7542 Impingement syndrome of left shoulder: Secondary | ICD-10-CM | POA: Diagnosis not present

## 2023-01-15 DIAGNOSIS — M9902 Segmental and somatic dysfunction of thoracic region: Secondary | ICD-10-CM | POA: Diagnosis not present

## 2023-01-15 DIAGNOSIS — M9907 Segmental and somatic dysfunction of upper extremity: Secondary | ICD-10-CM | POA: Diagnosis not present

## 2023-01-22 DIAGNOSIS — M9901 Segmental and somatic dysfunction of cervical region: Secondary | ICD-10-CM | POA: Diagnosis not present

## 2023-01-22 DIAGNOSIS — M7542 Impingement syndrome of left shoulder: Secondary | ICD-10-CM | POA: Diagnosis not present

## 2023-01-22 DIAGNOSIS — M9907 Segmental and somatic dysfunction of upper extremity: Secondary | ICD-10-CM | POA: Diagnosis not present

## 2023-01-22 DIAGNOSIS — M9902 Segmental and somatic dysfunction of thoracic region: Secondary | ICD-10-CM | POA: Diagnosis not present

## 2023-02-06 DIAGNOSIS — E785 Hyperlipidemia, unspecified: Secondary | ICD-10-CM | POA: Diagnosis not present

## 2023-02-07 LAB — LIPID PANEL
Chol/HDL Ratio: 2.3 ratio (ref 0.0–4.4)
Cholesterol, Total: 147 mg/dL (ref 100–199)
HDL: 63 mg/dL (ref >39)
LDL Chol Calc (NIH): 66 mg/dL (ref 0–99)
Triglycerides: 95 mg/dL (ref 0–149)
VLDL Cholesterol Cal: 18 mg/dL (ref 5–40)

## 2023-03-20 DIAGNOSIS — M7702 Medial epicondylitis, left elbow: Secondary | ICD-10-CM | POA: Diagnosis not present

## 2023-04-04 DIAGNOSIS — M25522 Pain in left elbow: Secondary | ICD-10-CM | POA: Diagnosis not present

## 2023-04-10 DIAGNOSIS — L570 Actinic keratosis: Secondary | ICD-10-CM | POA: Diagnosis not present

## 2023-04-10 DIAGNOSIS — L820 Inflamed seborrheic keratosis: Secondary | ICD-10-CM | POA: Diagnosis not present

## 2023-04-10 DIAGNOSIS — D485 Neoplasm of uncertain behavior of skin: Secondary | ICD-10-CM | POA: Diagnosis not present

## 2023-04-10 DIAGNOSIS — M7702 Medial epicondylitis, left elbow: Secondary | ICD-10-CM | POA: Diagnosis not present

## 2023-04-23 ENCOUNTER — Other Ambulatory Visit: Payer: Self-pay | Admitting: Internal Medicine

## 2023-04-23 DIAGNOSIS — Z0000 Encounter for general adult medical examination without abnormal findings: Secondary | ICD-10-CM

## 2023-06-01 DIAGNOSIS — E785 Hyperlipidemia, unspecified: Secondary | ICD-10-CM | POA: Diagnosis not present

## 2023-06-01 DIAGNOSIS — Z1389 Encounter for screening for other disorder: Secondary | ICD-10-CM | POA: Diagnosis not present

## 2023-06-01 DIAGNOSIS — M8580 Other specified disorders of bone density and structure, unspecified site: Secondary | ICD-10-CM | POA: Diagnosis not present

## 2023-06-01 DIAGNOSIS — Z0001 Encounter for general adult medical examination with abnormal findings: Secondary | ICD-10-CM | POA: Diagnosis not present

## 2023-06-05 DIAGNOSIS — Z23 Encounter for immunization: Secondary | ICD-10-CM | POA: Diagnosis not present

## 2023-06-05 DIAGNOSIS — Z0000 Encounter for general adult medical examination without abnormal findings: Secondary | ICD-10-CM | POA: Diagnosis not present

## 2023-06-05 DIAGNOSIS — G4700 Insomnia, unspecified: Secondary | ICD-10-CM | POA: Diagnosis not present

## 2023-06-23 DIAGNOSIS — M7702 Medial epicondylitis, left elbow: Secondary | ICD-10-CM | POA: Diagnosis not present

## 2023-06-24 ENCOUNTER — Encounter: Payer: Self-pay | Admitting: Radiology

## 2023-06-24 ENCOUNTER — Ambulatory Visit
Admission: RE | Admit: 2023-06-24 | Discharge: 2023-06-24 | Disposition: A | Discharge status: Home / Self Care | Payer: BC Managed Care – PPO | Source: Ambulatory Visit | Attending: Internal Medicine | Admitting: Internal Medicine

## 2023-06-24 DIAGNOSIS — Z0000 Encounter for general adult medical examination without abnormal findings: Secondary | ICD-10-CM

## 2023-06-24 DIAGNOSIS — Z1231 Encounter for screening mammogram for malignant neoplasm of breast: Secondary | ICD-10-CM | POA: Diagnosis not present

## 2023-06-25 DIAGNOSIS — Z01419 Encounter for gynecological examination (general) (routine) without abnormal findings: Secondary | ICD-10-CM | POA: Diagnosis not present

## 2023-07-23 DIAGNOSIS — M7702 Medial epicondylitis, left elbow: Secondary | ICD-10-CM | POA: Diagnosis not present

## 2023-08-03 DIAGNOSIS — D485 Neoplasm of uncertain behavior of skin: Secondary | ICD-10-CM | POA: Diagnosis not present

## 2023-08-03 DIAGNOSIS — Z85828 Personal history of other malignant neoplasm of skin: Secondary | ICD-10-CM | POA: Diagnosis not present

## 2023-08-03 DIAGNOSIS — L570 Actinic keratosis: Secondary | ICD-10-CM | POA: Diagnosis not present

## 2023-08-03 DIAGNOSIS — D2272 Melanocytic nevi of left lower limb, including hip: Secondary | ICD-10-CM | POA: Diagnosis not present

## 2023-08-03 DIAGNOSIS — D2271 Melanocytic nevi of right lower limb, including hip: Secondary | ICD-10-CM | POA: Diagnosis not present

## 2023-08-03 DIAGNOSIS — D225 Melanocytic nevi of trunk: Secondary | ICD-10-CM | POA: Diagnosis not present

## 2023-08-03 DIAGNOSIS — L821 Other seborrheic keratosis: Secondary | ICD-10-CM | POA: Diagnosis not present

## 2023-10-12 DIAGNOSIS — S56512A Strain of other extensor muscle, fascia and tendon at forearm level, left arm, initial encounter: Secondary | ICD-10-CM | POA: Diagnosis not present

## 2023-10-12 DIAGNOSIS — G8918 Other acute postprocedural pain: Secondary | ICD-10-CM | POA: Diagnosis not present

## 2023-10-12 DIAGNOSIS — Y999 Unspecified external cause status: Secondary | ICD-10-CM | POA: Diagnosis not present

## 2023-10-12 DIAGNOSIS — X58XXXA Exposure to other specified factors, initial encounter: Secondary | ICD-10-CM | POA: Diagnosis not present

## 2023-10-12 DIAGNOSIS — M7702 Medial epicondylitis, left elbow: Secondary | ICD-10-CM | POA: Diagnosis not present

## 2023-10-23 DIAGNOSIS — M25522 Pain in left elbow: Secondary | ICD-10-CM | POA: Diagnosis not present

## 2023-10-28 ENCOUNTER — Other Ambulatory Visit: Payer: Self-pay | Admitting: Cardiology

## 2023-10-30 DIAGNOSIS — M25622 Stiffness of left elbow, not elsewhere classified: Secondary | ICD-10-CM | POA: Diagnosis not present

## 2023-11-05 DIAGNOSIS — M25622 Stiffness of left elbow, not elsewhere classified: Secondary | ICD-10-CM | POA: Insufficient documentation

## 2023-11-06 DIAGNOSIS — M25622 Stiffness of left elbow, not elsewhere classified: Secondary | ICD-10-CM | POA: Diagnosis not present

## 2023-11-13 DIAGNOSIS — M25622 Stiffness of left elbow, not elsewhere classified: Secondary | ICD-10-CM | POA: Diagnosis not present

## 2023-11-20 DIAGNOSIS — M25622 Stiffness of left elbow, not elsewhere classified: Secondary | ICD-10-CM | POA: Diagnosis not present

## 2023-12-04 DIAGNOSIS — M25622 Stiffness of left elbow, not elsewhere classified: Secondary | ICD-10-CM | POA: Diagnosis not present

## 2023-12-18 DIAGNOSIS — M7702 Medial epicondylitis, left elbow: Secondary | ICD-10-CM | POA: Diagnosis not present

## 2024-01-06 DIAGNOSIS — Z85828 Personal history of other malignant neoplasm of skin: Secondary | ICD-10-CM | POA: Diagnosis not present

## 2024-01-06 DIAGNOSIS — L821 Other seborrheic keratosis: Secondary | ICD-10-CM | POA: Diagnosis not present

## 2024-01-06 DIAGNOSIS — L570 Actinic keratosis: Secondary | ICD-10-CM | POA: Diagnosis not present

## 2024-01-21 DIAGNOSIS — M7702 Medial epicondylitis, left elbow: Secondary | ICD-10-CM | POA: Diagnosis not present

## 2024-01-29 ENCOUNTER — Other Ambulatory Visit: Payer: Self-pay | Admitting: Cardiology

## 2024-05-07 ENCOUNTER — Other Ambulatory Visit: Payer: Self-pay | Admitting: Cardiology

## 2024-05-10 ENCOUNTER — Other Ambulatory Visit: Payer: Self-pay | Admitting: Internal Medicine

## 2024-05-10 DIAGNOSIS — Z1231 Encounter for screening mammogram for malignant neoplasm of breast: Secondary | ICD-10-CM

## 2024-05-10 DIAGNOSIS — L570 Actinic keratosis: Secondary | ICD-10-CM | POA: Diagnosis not present

## 2024-05-10 DIAGNOSIS — D0439 Carcinoma in situ of skin of other parts of face: Secondary | ICD-10-CM | POA: Diagnosis not present

## 2024-05-21 ENCOUNTER — Other Ambulatory Visit: Payer: Self-pay | Admitting: Cardiology

## 2024-06-07 ENCOUNTER — Other Ambulatory Visit: Payer: Self-pay | Admitting: Cardiology

## 2024-06-17 DIAGNOSIS — M8580 Other specified disorders of bone density and structure, unspecified site: Secondary | ICD-10-CM | POA: Diagnosis not present

## 2024-06-17 DIAGNOSIS — E785 Hyperlipidemia, unspecified: Secondary | ICD-10-CM | POA: Diagnosis not present

## 2024-06-24 ENCOUNTER — Ambulatory Visit
Admission: RE | Admit: 2024-06-24 | Discharge: 2024-06-24 | Disposition: A | Discharge status: Home / Self Care | Source: Ambulatory Visit | Attending: Internal Medicine | Admitting: Internal Medicine

## 2024-06-24 DIAGNOSIS — Z1339 Encounter for screening examination for other mental health and behavioral disorders: Secondary | ICD-10-CM | POA: Diagnosis not present

## 2024-06-24 DIAGNOSIS — Z1212 Encounter for screening for malignant neoplasm of rectum: Secondary | ICD-10-CM | POA: Diagnosis not present

## 2024-06-24 DIAGNOSIS — Z1231 Encounter for screening mammogram for malignant neoplasm of breast: Secondary | ICD-10-CM

## 2024-06-24 DIAGNOSIS — Z1331 Encounter for screening for depression: Secondary | ICD-10-CM | POA: Diagnosis not present

## 2024-06-24 DIAGNOSIS — R82998 Other abnormal findings in urine: Secondary | ICD-10-CM | POA: Diagnosis not present

## 2024-06-24 DIAGNOSIS — I2510 Atherosclerotic heart disease of native coronary artery without angina pectoris: Secondary | ICD-10-CM | POA: Diagnosis not present

## 2024-06-24 DIAGNOSIS — Z0000 Encounter for general adult medical examination without abnormal findings: Secondary | ICD-10-CM | POA: Diagnosis not present

## 2024-07-12 DIAGNOSIS — I493 Ventricular premature depolarization: Secondary | ICD-10-CM | POA: Insufficient documentation

## 2024-07-12 DIAGNOSIS — Z1331 Encounter for screening for depression: Secondary | ICD-10-CM | POA: Diagnosis not present

## 2024-07-12 DIAGNOSIS — Z01419 Encounter for gynecological examination (general) (routine) without abnormal findings: Secondary | ICD-10-CM | POA: Diagnosis not present

## 2024-07-12 DIAGNOSIS — Z124 Encounter for screening for malignant neoplasm of cervix: Secondary | ICD-10-CM | POA: Diagnosis not present

## 2024-07-18 DIAGNOSIS — D259 Leiomyoma of uterus, unspecified: Secondary | ICD-10-CM | POA: Insufficient documentation

## 2024-07-18 DIAGNOSIS — M8580 Other specified disorders of bone density and structure, unspecified site: Secondary | ICD-10-CM | POA: Insufficient documentation

## 2024-07-18 DIAGNOSIS — N393 Stress incontinence (female) (male): Secondary | ICD-10-CM | POA: Insufficient documentation

## 2024-07-18 DIAGNOSIS — N9089 Other specified noninflammatory disorders of vulva and perineum: Secondary | ICD-10-CM | POA: Insufficient documentation

## 2024-07-18 DIAGNOSIS — G4700 Insomnia, unspecified: Secondary | ICD-10-CM | POA: Insufficient documentation

## 2024-07-18 DIAGNOSIS — R002 Palpitations: Secondary | ICD-10-CM | POA: Insufficient documentation

## 2024-07-18 DIAGNOSIS — N952 Postmenopausal atrophic vaginitis: Secondary | ICD-10-CM | POA: Insufficient documentation

## 2024-08-01 ENCOUNTER — Ambulatory Visit: Admitting: *Deleted

## 2024-08-01 VITALS — Ht 63.0 in | Wt 130.0 lb

## 2024-08-01 DIAGNOSIS — Z1211 Encounter for screening for malignant neoplasm of colon: Secondary | ICD-10-CM

## 2024-08-01 DIAGNOSIS — Z83719 Family history of colon polyps, unspecified: Secondary | ICD-10-CM

## 2024-08-01 MED ORDER — NA SULFATE-K SULFATE-MG SULF 17.5-3.13-1.6 GM/177ML PO SOLN: 1.0000 | Freq: Once | ORAL | 0 refills | Status: AC

## 2024-08-01 NOTE — Progress Notes (Signed)
 Pt's name and DOB verified at the beginning of the pre-visit with 2 identifiers  Pt denies any difficulty with ambulating,sitting, laying down or rolling side to side  Pt has no issues moving head neck or swallowing  No egg or soy allergy known to patient   No issues known to pt with past sedation  No FH of Malignant Hyperthermia  Pt is not on home 02   Pt is not on blood thinners   Pt has frequent issues with constipation RN instructed pt to use Miralax per bottles instructions a week before prep days. Pt states they will  Pt is not on dialysis  Pt hx of PVC  Pt denies any upcoming cardiac testing  Patient's chart reviewed by Norleen Schillings CNRA prior to pre-visit and patient appropriate for the [facility].  Pre-visit completed and red dot placed by patient's name on their procedure day (on provider's schedule).     Visit by phone  Pt states weight is 130 lb  Pt given  both [facility] main # and MD on call # prior to instructions.  Informed pt to come in at the time discussed and is shown on PV instructions.  Pt instructed to use Singlecare.com or GoodRx for a price reduction on prep  Instructed pt where to find PV instructions in My Chart. . Instructed pt on all aspects of written instructions including med holds clothing to wear and foods to eat and not eat as well as after procedure legal restrictions and to call MD on call if needed.. Pt states understanding. Instructed pt to review instructions again prior to procedure and call main # given if has any questions or any issues. Pt states they will.

## 2024-08-16 ENCOUNTER — Encounter: Payer: Self-pay | Admitting: Gastroenterology

## 2024-08-18 ENCOUNTER — Encounter: Payer: Self-pay | Admitting: Gastroenterology

## 2024-08-18 ENCOUNTER — Ambulatory Visit: Admitting: Gastroenterology

## 2024-08-18 VITALS — BP 107/58 | HR 61 | Temp 98.2°F | Resp 15 | Ht 63.0 in | Wt 130.0 lb

## 2024-08-18 DIAGNOSIS — K644 Residual hemorrhoidal skin tags: Secondary | ICD-10-CM | POA: Diagnosis not present

## 2024-08-18 DIAGNOSIS — K5730 Diverticulosis of large intestine without perforation or abscess without bleeding: Secondary | ICD-10-CM | POA: Diagnosis not present

## 2024-08-18 DIAGNOSIS — K635 Polyp of colon: Secondary | ICD-10-CM | POA: Diagnosis not present

## 2024-08-18 DIAGNOSIS — Z1211 Encounter for screening for malignant neoplasm of colon: Secondary | ICD-10-CM | POA: Diagnosis present

## 2024-08-18 DIAGNOSIS — D122 Benign neoplasm of ascending colon: Secondary | ICD-10-CM

## 2024-08-18 DIAGNOSIS — K648 Other hemorrhoids: Secondary | ICD-10-CM

## 2024-08-18 MED ORDER — SODIUM CHLORIDE 0.9 % IV SOLN: 500.0000 mL | INTRAVENOUS | Status: AC

## 2024-08-18 NOTE — Progress Notes (Signed)
 Called to room to assist during endoscopic procedure.  Patient ID and intended procedure confirmed with present staff. Received instructions for my participation in the procedure from the performing physician.

## 2024-08-18 NOTE — Op Note (Signed)
 [facility] Patient Name: Destiny [name] Procedure Date: 08/18/2024 7:40 AM MRN: [id number] Endoscopist: Gustav ALONSO Mcgee , MD, [id number] Age: 64 Referring MD:  Date of Birth: 11-06-1960 Gender: Female Account #: [id number] Procedure:                Colonoscopy Indications:              Screening for colorectal malignant neoplasm, Last                            colonoscopy: 2015 Medicines:                Monitored Anesthesia Care Procedure:                Pre-Anesthesia Assessment:                           - Prior to the procedure, a History and Physical                            was performed, and patient medications and                            allergies were reviewed. The patient's tolerance of                            previous anesthesia was also reviewed. The risks                            and benefits of the procedure and the sedation                            options and risks were discussed with the patient.                            All questions were answered, and informed consent                            was obtained. Prior Anticoagulants: The patient has                            taken no anticoagulant or antiplatelet agents. ASA                            Grade Assessment: II - A patient with mild systemic                            disease. After reviewing the risks and benefits,                            the patient was deemed in satisfactory condition to                            undergo the procedure.  After obtaining informed consent, the colonoscope                            was passed under direct vision. Throughout the                            procedure, the patient's blood pressure, pulse, and                            oxygen saturations were monitored continuously. The                            Olympus Scope [id number] was introduced through the                            anus and advanced to the the terminal  ileum, with                            identification of the appendiceal orifice and IC                            valve. The colonoscopy was performed without                            difficulty. The patient tolerated the procedure                            well. The quality of the bowel preparation was                            good. The terminal ileum, ileocecal valve,                            appendiceal orifice, and rectum were photographed. Scope In: 8:18:19 AM Scope Out: 8:37:39 AM Scope Withdrawal Time: 0 hours 11 minutes 58 seconds  Total Procedure Duration: 0 hours 19 minutes 20 seconds  Findings:                 The perianal and digital rectal examinations were                            normal.                           A 3 mm polyp was found in the ascending colon. The                            polyp was sessile. The polyp was removed with a                            cold snare. Resection and retrieval were complete.                           Scattered small-mouthed diverticula were found in  the sigmoid colon, descending colon, transverse                            colon and ascending colon.                           Non-bleeding external and internal hemorrhoids were                            found during retroflexion. The hemorrhoids were                            medium-sized. Complications:            No immediate complications. Estimated Blood Loss:     Estimated blood loss was minimal. Impression:               - One 3 mm polyp in the ascending colon, removed                            with a cold snare. Resected and retrieved.                           - Diverticulosis in the sigmoid colon, in the                            descending colon, in the transverse colon and in                            the ascending colon.                           - Non-bleeding external and internal hemorrhoids. Recommendation:           - Resume  previous diet.                           - Continue present medications.                           - Await pathology results.                           - Repeat colonoscopy in 5-10 years for surveillance                            based on pathology results. Lin Glazier V. Riggs Dineen, MD 08/18/2024 8:46:23 AM This report has been signed electronically.

## 2024-08-18 NOTE — Progress Notes (Signed)
 A/o x 3, VSS, good SR's, pleased with anesthesia, report to RN

## 2024-08-18 NOTE — Patient Instructions (Addendum)
 Resume previous diet  -  pathology results and follow up recommendations will be send by letter or MyChart.    YOU HAD AN ENDOSCOPIC PROCEDURE TODAY AT THE [facility]:   Refer to the procedure report that was given to you for any specific questions about what was found during the examination.  If the procedure report does not answer your questions, please call your gastroenterologist to clarify.  If you requested that your care partner not be given the details of your procedure findings, then the procedure report has been included in a sealed envelope for you to review at your convenience later.  YOU SHOULD EXPECT: Some feelings of bloating in the abdomen. Passage of more gas than usual.  Walking can help get rid of the air that was put into your GI tract during the procedure and reduce the bloating. If you had a lower endoscopy (such as a colonoscopy or flexible sigmoidoscopy) you may notice spotting of blood in your stool or on the toilet paper. If you underwent a bowel prep for your procedure, you may not have a normal bowel movement for a few days.  Please Note:  You might notice some irritation and congestion in your nose or some drainage.  This is from the oxygen used during your procedure.  There is no need for concern and it should clear up in a day or so.  SYMPTOMS TO REPORT IMMEDIATELY:  Following lower endoscopy (colonoscopy or flexible sigmoidoscopy):  Excessive amounts of blood in the stool  Significant tenderness or worsening of abdominal pains  Swelling of the abdomen that is new, acute  Fever of 100F or higher  For urgent or emergent issues, a gastroenterologist can be reached at any hour by calling [phone]. Do not use MyChart messaging for urgent concerns.    DIET:  We do recommend a small meal at first, but then you may proceed to your regular diet.  Drink plenty of fluids but you should avoid alcoholic beverages for 24 hours.  ACTIVITY:  You should plan  to take it easy for the rest of today and you should NOT DRIVE or use heavy machinery until tomorrow (because of the sedation medicines used during the test).    FOLLOW UP: Our staff will call the number listed on your records the next business day following your procedure.  We will call around 7:15- 8:00 am to check on you and address any questions or concerns that you may have regarding the information given to you following your procedure. If we do not reach you, we will leave a message.     If any biopsies were taken you will be contacted by phone or by letter within the next 1-3 weeks.  Please call us  at [phone] if you have not heard about the biopsies in 3 weeks.    SIGNATURES/CONFIDENTIALITY: You and/or your care partner have signed paperwork which will be entered into your electronic medical record.  These signatures attest to the fact that that the information above on your After Visit Summary has been reviewed and is understood.  Full responsibility of the confidentiality of this discharge information lies with you and/or your care-partner.

## 2024-08-18 NOTE — Progress Notes (Signed)
 Pt's states no medical or surgical changes since previsit or office visit.

## 2024-08-18 NOTE — Progress Notes (Signed)
  Gastroenterology History and Physical   Primary Care Physician:  Onita Rush, MD   Reason for Procedure:  Colorectal cancer screening  Plan:    Screening colonoscopy with possible interventions as needed     HPI: Destiny [name] is a very pleasant 64 y.o. female here for screening colonoscopy. Denies any nausea, vomiting, abdominal pain, melena or bright red blood per rectum  The risks and benefits as well as alternatives of endoscopic procedure(s) have been discussed and reviewed.  The patient was provided an opportunity to ask questions and all were answered. The patient agreed with the plan and demonstrated an understanding of the instructions.   Past Medical History:  Diagnosis Date   Dysphagia    Environmental allergies    trees and pollen   Hyperlipidemia    PVC (premature ventricular contraction)     Past Surgical History:  Procedure Laterality Date   COLONOSCOPY     L elbow surgery     golf elbow   ROTATOR CUFF REPAIR Bilateral    March 2015 right   SHOULDER SURGERY     frozen shoulder   VAGINAL DELIVERY     x 4    Prior to Admission medications  Medication Sig Start Date End Date Taking? Authorizing Provider  ascorbic acid (VITAMIN C) 250 MG CHEW    Yes [provider]  rosuvastatin  (CRESTOR ) 20 MG tablet TAKE 1 TABLET (20 MG TOTAL) BY MOUTH DAILY. PLEASE CALL [phone] TO SCHEDULE AN OVERDUE APPOINTMENT FOR FUTURE REFILLS. THANK YOU. 2ND ATTEMPT. 05/24/24  Yes Kate Lonni CROME, MD  vitamin B-12 (CYANOCOBALAMIN) 100 MCG tablet Take 100 mcg by mouth daily. 2 tablets daily   Yes [provider]  Calcium  Citrate 250 MG TABS  12/17/21   [provider]  calcium -vitamin D (OSCAL WITH D) 500-5 MG-MCG tablet Take 1 tablet by mouth. Patient not taking: Reported on 08/18/2024    [provider]  cetirizine (ZYRTEC ALLERGY) 10 MG tablet  08/19/18   [provider]  Cholecalciferol (VITAMIN D3) 50 MCG (2000 UT)  capsule daily.    [provider]  Collagen-Vitamin C (GNP COLLAGEN PLUS VITAMIN C) 1000-10 MG TABS Take one tablet by mouth every day Oral 12/04/09   [provider]  COVID-19 mRNA vaccine [id number] (COMIRNATY ) syringe Inject into the muscle. 08/12/22   Luiz Channel, MD  hydrocortisone (ANUSOL-HC) 25 MG suppository 2 (two) times daily as needed. 04/02/23   [provider]  MAGNESIUM PO Take 1 tablet by mouth daily. Takes with Vitamin D    [provider]  Omega-3 Fatty Acids (FISH OIL) 1200 MG CPDR Take by mouth.    [provider]  YUVAFEM 10 MCG TABS vaginal tablet as directed. 07/12/24   [provider]  zolpidem (AMBIEN) 5 MG tablet as needed. 04/02/23   [provider]    Current Outpatient Medications  Medication Sig Dispense Refill   ascorbic acid (VITAMIN C) 250 MG CHEW      rosuvastatin  (CRESTOR ) 20 MG tablet TAKE 1 TABLET (20 MG TOTAL) BY MOUTH DAILY. PLEASE CALL [phone] TO SCHEDULE AN OVERDUE APPOINTMENT FOR FUTURE REFILLS. THANK YOU. 2ND ATTEMPT. 15 tablet 0   vitamin B-12 (CYANOCOBALAMIN) 100 MCG tablet Take 100 mcg by mouth daily. 2 tablets daily     Calcium  Citrate 250 MG TABS  (Patient not taking: No sig reported)     calcium -vitamin D (OSCAL WITH D) 500-5 MG-MCG tablet Take 1 tablet by mouth. (Patient not taking: Reported  on 08/18/2024)     cetirizine (ZYRTEC ALLERGY) 10 MG tablet  (Patient taking differently: as needed.)     Cholecalciferol (VITAMIN D3) 50 MCG (2000 UT) capsule daily.     Collagen-Vitamin C (GNP COLLAGEN PLUS VITAMIN C) 1000-10 MG TABS Take one tablet by mouth every day Oral     COVID-19 mRNA vaccine 2023-2024 (COMIRNATY ) syringe Inject into the muscle. 0.3 mL 0   hydrocortisone (ANUSOL-HC) 25 MG suppository 2 (two) times daily as needed.     MAGNESIUM PO Take 1 tablet by mouth daily. Takes with Vitamin D     Omega-3 Fatty Acids (FISH OIL) 1200 MG CPDR Take by mouth.     YUVAFEM 10 MCG TABS  vaginal tablet as directed.     zolpidem (AMBIEN) 5 MG tablet as needed.     Current Facility-Administered Medications  Medication Dose Route Frequency Provider Last Rate Last Admin   0.9 %  sodium chloride  infusion  500 mL Intravenous Continuous Astou Lada V, MD        Allergies as of 08/18/2024 - Review Complete 08/18/2024  Allergen Reaction Noted   Molds & smuts Other (See Comments) 08/01/2024   Doxycycline Rash 12/23/2016   Sulfa antibiotics Rash 12/03/2017    Family History  Problem Relation Age of Onset   Colon polyps Brother    Breast cancer Maternal Aunt 66 - 69   Breast cancer Maternal Grandmother 64   Colon cancer Neg Hx    Pancreatic cancer Neg Hx    Rectal cancer Neg Hx    Stomach cancer Neg Hx    BRCA 1/2 Neg Hx    Esophageal cancer Neg Hx     Social History   Socioeconomic History   Marital status: Married    Spouse name: Not on file   Number of children: Not on file   Years of education: Not on file   Highest education level: Not on file  Occupational History   Not on file  Tobacco Use   Smoking status: Never   Smokeless tobacco: Never  Vaping Use   Vaping status: Never Used  Substance and Sexual Activity   Alcohol use: Yes    Alcohol/week: 2.0 - 4.0 standard drinks of alcohol    Types: 2 - 4 Glasses of wine per week   Drug use: No   Sexual activity: Not on file  Other Topics Concern   Not on file  Social History Narrative   Not on file   Social Drivers of Health   Tobacco Use: Low Risk (08/18/2024)   Patient History    Smoking Tobacco Use: Never    Smokeless Tobacco Use: Never    Passive Exposure: Not on file  Financial Resource Strain: Not on file  Food Insecurity: Not on file  Transportation Needs: Not on file  Physical Activity: Not on file  Stress: Not on file  Social Connections: Not on file  Intimate Partner Violence: Not on file  Depression (EYV7-0): Not on file  Alcohol Screen: Not on file  Housing: Not on file   Utilities: Not on file  Health Literacy: Not on file    Review of Systems:  All other review of systems negative except as mentioned in the HPI.  Physical Exam: Vital signs in last 24 hours: BP 131/71   Pulse 60   Temp 98.2 F (36.8 C) (Temporal)   Resp 13   Ht 5' 3 (1.6 m)   Wt 130 lb (59 kg)   LMP 03/11/2013  SpO2 100%   BMI 23.03 kg/m  General:   Alert, NAD Lungs:  Clear .   Heart:  Regular rate and rhythm Abdomen:  Soft, nontender and nondistended. Neuro/Psych:  Alert and cooperative. Normal mood and affect. A and O x 3  Reviewed labs, radiology imaging, old records and pertinent past GI work up  Patient is appropriate for planned procedure(s) and anesthesia in an ambulatory setting   K. Veena Abriella Filkins , MD [phone]

## 2024-08-19 ENCOUNTER — Telehealth: Payer: Self-pay | Admitting: *Deleted

## 2024-08-19 NOTE — Telephone Encounter (Signed)
" °  Follow up Call-     08/18/2024    7:21 AM  Call back number  Post procedure Call Back phone  # [phone]  Permission to leave phone message Yes     Patient questions:  Do you have a fever, pain , or abdominal swelling? No. Pain Score  0 *  Have you tolerated food without any problems? Yes.    Have you been able to return to your normal activities? Yes.    Do you have any questions about your discharge instructions: Diet   No. Medications  No. Follow up visit  No.  Do you have questions or concerns about your Care? No.  Actions: * If pain score is 4 or above: No action needed, pain <4.   "

## 2024-08-22 LAB — SURGICAL PATHOLOGY

## 2024-08-25 ENCOUNTER — Ambulatory Visit: Payer: Self-pay | Admitting: Gastroenterology

## 2024-10-18 ENCOUNTER — Encounter: Admitting: Gastroenterology
